# Patient Record
Sex: Female | Born: 1975
Health system: Southern US, Community
[De-identification: ages and names within clinical notes are randomized; demographics above are authoritative.]

## PROBLEM LIST (undated history)

## (undated) DIAGNOSIS — I10 Essential (primary) hypertension: Secondary | ICD-10-CM

## (undated) DIAGNOSIS — R600 Localized edema: Secondary | ICD-10-CM

## (undated) DIAGNOSIS — K219 Gastro-esophageal reflux disease without esophagitis: Secondary | ICD-10-CM

## (undated) DIAGNOSIS — R0683 Snoring: Secondary | ICD-10-CM

## (undated) DIAGNOSIS — E559 Vitamin D deficiency, unspecified: Secondary | ICD-10-CM

## (undated) DIAGNOSIS — E669 Obesity, unspecified: Secondary | ICD-10-CM

## (undated) DIAGNOSIS — K760 Fatty (change of) liver, not elsewhere classified: Secondary | ICD-10-CM

## (undated) DIAGNOSIS — E119 Type 2 diabetes mellitus without complications: Secondary | ICD-10-CM

## (undated) DIAGNOSIS — D4989 Neoplasm of unspecified behavior of other specified sites: Secondary | ICD-10-CM

## (undated) DIAGNOSIS — R5383 Other fatigue: Secondary | ICD-10-CM

## (undated) DIAGNOSIS — R609 Edema, unspecified: Secondary | ICD-10-CM

## (undated) HISTORY — PX: CHOLECYSTECTOMY: SHX55

## (undated) HISTORY — PX: CERVICAL SPINE SURGERY: SHX589

## (undated) HISTORY — DX: Type 2 diabetes mellitus without complications: E11.9

## (undated) HISTORY — DX: Vitamin D deficiency, unspecified: E55.9

## (undated) HISTORY — PX: TONSILLECTOMY AND ADENOIDECTOMY: SUR1326

## (undated) HISTORY — DX: Other fatigue: R53.83

## (undated) HISTORY — DX: Obesity, unspecified: E66.9

## (undated) HISTORY — PX: MYRINGOTOMY WITH TUBE PLACEMENT: SHX5663

## (undated) HISTORY — DX: Fatty (change of) liver, not elsewhere classified: K76.0

## (undated) HISTORY — PX: ABDOMINAL HYSTERECTOMY: SHX81

## (undated) HISTORY — DX: Neoplasm of unspecified behavior of other specified sites: D49.89

## (undated) HISTORY — DX: Essential (primary) hypertension: I10

## (undated) HISTORY — DX: Snoring: R06.83

## (undated) HISTORY — PX: LIPOMA EXCISION: SHX5283

## (undated) HISTORY — DX: Gastro-esophageal reflux disease without esophagitis: K21.9

---

## 2004-10-12 ENCOUNTER — Encounter (INDEPENDENT_AMBULATORY_CARE_PROVIDER_SITE_OTHER): Payer: Self-pay | Admitting: *Deleted

## 2004-10-12 ENCOUNTER — Ambulatory Visit (HOSPITAL_COMMUNITY): Admission: RE | Admit: 2004-10-12 | Discharge: 2004-10-12 | Payer: Self-pay | Admitting: Obstetrics and Gynecology

## 2004-10-13 ENCOUNTER — Encounter (INDEPENDENT_AMBULATORY_CARE_PROVIDER_SITE_OTHER): Payer: Self-pay | Admitting: *Deleted

## 2004-10-13 ENCOUNTER — Inpatient Hospital Stay (HOSPITAL_COMMUNITY): Admission: RE | Admit: 2004-10-13 | Discharge: 2004-10-15 | Payer: Self-pay | Admitting: Obstetrics and Gynecology

## 2005-07-27 ENCOUNTER — Encounter: Admission: RE | Admit: 2005-07-27 | Discharge: 2005-10-25 | Payer: Self-pay | Admitting: Specialist

## 2005-12-12 ENCOUNTER — Ambulatory Visit: Payer: Self-pay | Admitting: Family Medicine

## 2008-10-08 ENCOUNTER — Inpatient Hospital Stay (HOSPITAL_COMMUNITY): Admission: AD | Admit: 2008-10-08 | Discharge: 2008-10-10 | Payer: Self-pay | Admitting: Orthopedic Surgery

## 2010-09-21 LAB — CBC
Hemoglobin: 13.3 g/dL (ref 12.0–15.0)
MCHC: 35.2 g/dL (ref 30.0–36.0)
RBC: 3.94 MIL/uL (ref 3.87–5.11)
RDW: 11.6 % (ref 11.5–15.5)
WBC: 18.4 10*3/uL — ABNORMAL HIGH (ref 4.0–10.5)

## 2010-09-21 LAB — BASIC METABOLIC PANEL
Calcium: 9.6 mg/dL (ref 8.4–10.5)
GFR calc Af Amer: >60 mL/min (ref >60)
GFR calc non Af Amer: >60 mL/min (ref >60)
Sodium: 141 mEq/L (ref 135–145)

## 2010-10-25 NOTE — Op Note (Signed)
Deborah, Sheppard                 ACCOUNT NO.:  [id number]   MEDICAL RECORD NO.:  [id number]          PATIENT TYPE:  OIB   LOCATION:  5016                         FACILITY:  [facility]   PHYSICIAN:  Alvy Beal, MD    DATE OF BIRTH:  Nov 14, 1975   DATE OF PROCEDURE:  DATE OF DISCHARGE:                               OPERATIVE REPORT   PREOPERATIVE DIAGNOSIS:  Cervical disk herniation with left arm  radiculopathy.   POSTOPERATIVE DIAGNOSIS:  Cervical disk herniation with left arm  radiculopathy.   OPERATIVE PROCEDURE:  Anterior cervical diskectomy and fusion at C5-C6.   COMPLICATIONS:  None.   CONDITION:  Stable.   FIRST ASSISTANT:  Crissie Reese, PA   INSTRUMENTATION USED:  Synthes anterior cervical vector plate with 16-XW  locking screws and 14-mm plate with an 8-mm precut MTLF lordotic fibular  spacer packed with Actifuse.   HISTORY:  This is a very pleasant 35 year old woman who presents with  progressive neck and left arm pain.  Clinical and radiographic analysis  confirmed the diagnosis of a soft disk protrusion causing herniation and  causing C6 radicular pain.  Her clinical exam is consistent with this.  Attempts at conservative management have failed to alleviate her  symptoms and so she elected to proceed with surgery.  All appropriate  risks, benefits and alternatives were discussed with the patient and  consent was obtained.   OPERATIVE NOTE:  The patient was brought to the operating room, placed  supine on the operating table.  After successful induction of general  anesthesia and endotracheal intubation, SCDs and TEDs were applied.  Rolled towels were placed between the shoulder blades and the neck was  placed into a neutral alignment.  The cervical spine was then prepped  and draped in a standard fashion.   A transverse incision was made over the thyroid cartilage and sharp  dissection was carried out down to and through the platysma.  I then  bluntly  dissected through the deep cervical fascia and prevertebral  fascia keeping the trachea and esophagus medially and the carotid sheath  laterally.  I was then able to palpate the anterior longitudinal  ligament.  I palpated the carotid sheath and then placed an appendiceal  retractor to retract the trachea and esophagus.  This allowed me to  protect the carotid sheath that was inserted in the retractor.   Kitner dissectors were used to mobilize the remaining prevertebral  fascia to completely expose the anterior longitudinal ligament.  I then  placed a needle into the C5-C6 disk space and took an x-ray.  Intraoperative fluoroscopy confirmed that I was at the appropriate  level.   I then mobilized the anterior longitudinal ligament from the midbody of  C5 to the midbody of C6 using bipolar electrocautery and Bovie.  I then  dissected underneath the longus colli muscles bilaterally to expose out  to the uncovertebral joints.   Once I had an adequate exposure, I then placed 40-mm self-retaining  Caspar retractor blades into the wound made sure the blades were  underneath the longus colli.  I  then deflated the endotracheal cuff,  expanded, then retracted and reinflated the endotracheal cuff.  This  allowed me to reset the endotracheal pressure.  I then placed  distraction pins into the bodies of C5-C6 and gently distracted the C5-  C6 disk space.   An annulotomy was performed with a 15 blade scalpel and then using a  combination of pituitary rongeurs, curettes and Kerrison rongeurs, I  resected all of the disk material.  Once I was down to the posterior  aspect, I used a 1-mm Kerrison rongeur to resect the underlying  posterior osteophytes and to get underneath the annulus.  At this point,  I visualized the posterior longitudinal ligament.  Using a fine curette,  I developed a plane underneath the posterior longitudinal ligament and  resected this with a 1-mm Kerrison.  I then made sure I  was able to go  underneath the uncovertebral joint, especially on the left-hand side as  this was the more symptomatic side to ensure the nerve roots adequate  and the C6 nerve was adequately decompressed.  Once I confirmed this, I  then proceeded with my instrumentation.  I measured the interbody space,  took a precut lordotic MTLF fibular spacer 8 mm high, soaked it  appropriately and then packed it with Actifuse and malleted it to the  appropriate finding resting position.  I confirmed satisfactory position  in the AP and lateral planes and I took a 14-mm anterior cervical vector  plate, contoured into gentle lordosis and then secured it with 14-mm  self-drilling and self-tapping screws.  They were all locked down.  Final x-rays demonstrated satisfactory position of the hardware and  graft.  I then removed the self-retaining retractors and again checked  the esophagus to ensure that it was not inadvertently entrapped  underneath the plate.  I irrigated copiously, returned the trachea and  esophagus to midline and obtained hemostasis using bipolar  electrocautery.  I then closed the platysma with interrupted 2-0 Vicryl  sutures and the skin with a 3-0 Monocryl.  Steri-Strips, dry dressing  and Aspen collar were applied and the patient was ultimately extubated  and transferred to the PACU without incident.  At the end of the case,  all needle and sponge counts were correct.      Alvy Beal, MD  Electronically Signed     DDB/MEDQ  D:  10/08/2008  T:  10/08/2008  Job:  [id number]

## 2010-10-28 NOTE — Discharge Summary (Signed)
[name], Sheppard                 ACCOUNT NO.:  [id number]   MEDICAL RECORD NO.:  [id number]          PATIENT TYPE:  INP   LOCATION:  5016                         FACILITY:  [facility]   PHYSICIAN:  Alvy Beal, MD    DATE OF BIRTH:  Apr 18, 1976   DATE OF ADMISSION:  10/08/2008  DATE OF DISCHARGE:  10/10/2008                               DISCHARGE SUMMARY   ADMISSION DIAGNOSIS:  Cervical disk herniation with left arm  radiculopathy.   DISCHARGE DIAGNOSIS:  Cervical disk herniation with left arm  radiculopathy.   OPERATIVE PROCEDURE:  Anterior cervical diskectomy and fusion at C5-C6  level done on October 08, 2008.   CONSULTATIONS:  None.   BRIEF HISTORY:  Ms. Deborah Sheppard is a very pleasant 35 year old female who  presented with progressive neck and left arm pain.  Clinical and  radiographic analysis confirmed the diagnosis of a soft disk protrusion  causing herniation and causing C6 radicular pain.  Her clinical exam was  consistent with this.  All attempts at conservative management had  failed to alleviate her symptoms.  Therefore, she elected to proceed  with surgery.  All appropriate risks, benefits, and alternatives were  discussed with the patient and a consent was obtained.   HOSPITAL COURSE:  The patient's hospital course was approximately 2 days  in length.  The patient tolerated the procedure very well and was  transferred from the PACU to the orthopedic floor without any incidence.  Postoperatively on day #1, the patient's arm pain had improved.  She had  some mild complaints of nausea and vomiting without emesis.  Vital signs  were stable and afebrile.  She was neurovascularly intact.  She had  began to void on her own.  Her dressing was clean, dry, and intact and  she was scheduled to begin working with the physical therapy.  Postoperatively on day #2, the patient had made significant progress  with physical therapy.  Her pain was controlled on oral medications.  Radiographs had been reviewed by Dr. Shon Baton in the office.  They  demonstrated stable placement of the hardware.  Nausea and vomiting had  significantly improved.  Therefore, she was deemed stable to be  discharged home with Home Health Physical Therapy.   DISCHARGE MEDICATIONS:  The patient was allowed to continue her Zantac  300 mg daily, Neurontin 300 mg 3 times a day, and multivitamins daily.  She was instructed to discontinue the use of her Norco 5/325.  She is  being discharged home on the new medication of:  1. Percocet 10/325 one tablet p.o. q.6 h. p.r.n. pain.  2. Robaxin 500 mg 1 tablet p.o. q.8 h. p.r.n. spasms.  3. Phenergan 25 mg 1 tablet p.o. q.8 h. p.r.n. nausea or vomiting.  4. Baby aspirin 81 mg 1 tablet daily.   DISCHARGE INSTRUCTIONS:  The patient was given preprinted discharge  instructions from our office that explained in detail activity such as,  the patient is to lift nothing heavier than 8 gallons of milk.  She is  to walk every day.  She is to change her  dressing daily x7 days.  She is  allowed to shower postoperatively on day #5.  No baths or soaks or hot  tubs.  She can after 7 days.  She can leave her incision open to air and  Steri-Strips are to remain intact.  She is to call our office for any  increasing arm pain, neck pain, loss of bowel or bladder function,  increasing fevers  or redness or erythema around the incision site.  The patient is  scheduled to follow up at our office in 2 weeks.  She needs to call our  office at [phone] to set up this appointment.  At that time, we will  remove her sutures and inspect her wound.  The patient needs to call our  office again at [phone] to schedule this appointment.      Crissie Reese, PA      Alvy Beal, MD  Electronically Signed    AC/MEDQ  D:  11/05/2008  T:  11/06/2008  Job:  [id number]

## 2010-10-28 NOTE — Discharge Summary (Signed)
NAMEENORA, TRILLO                 ACCOUNT NO.:  [id number]   MEDICAL RECORD NO.:  [id number]          PATIENT TYPE:  INP   LOCATION:  9312                          FACILITY:  [facility]   PHYSICIAN:  Malva Limes, M.D.    DATE OF BIRTH:  1976/05/22   DATE OF ADMISSION:  10/13/2004  DATE OF DISCHARGE:  10/15/2004                                 DISCHARGE SUMMARY   PRINCIPAL DISCHARGE DIAGNOSES:  1. Severe cervical dysplasia.  2. Menorrhagia.  3. Dysmenorrhea.     HISTORY OF PRESENT ILLNESS:  Ms. Ringstad is a 35 year old white female, G5,  P5, who presented to the operating room on Oct 13, 2004, for a total vaginal  hysterectomy secondary to a history of severe cervical dysplasia,  menorrhagia and dysmenorrhea.  The patient has a multiple-year history of  dysplasia.  When the dysplasia was first discovered several years ago, she  was scheduled for a LEEP and did not follow through.  More recently she had  a Pap smear revealing severe dysplasia with the possibility of carcinoma.  Because of this patient underwent a cold knife cone on Oct 12, 2004.  The  pathology revealed only severe dysplasia.  The margins were negative.   HOSPITAL COURSE:  The patient was underwent a total vaginal hysterectomy on  Oct 13, 2004, without complications.  A complete description of this can be  found in the dictated operative note.  The patient's pathology revealed only  CIN-I.  Everything else was benign.  The patient's postoperative course was  complicated by nausea, which was felt to be secondary to morphine use.  Once  this was changed and Phenergan added, the patient's symptoms improved.  The  patient was discharged to home on postoperative day #2.  She was sent home  with Demerol, Motrin, Transderm scopolamine and some Nexium.  The patient's  postoperative hemoglobin was 10.8.  At the time of discharge the patient's  vital signs are stable, she is afebrile, she was ambulating without  difficulty and had  flatus.      MA/MEDQ  D:  11/09/2004  T:  11/09/2004  Job:  [id number]

## 2010-10-28 NOTE — Op Note (Signed)
NAMECHARDE, Deborah Sheppard                 ACCOUNT NO.:  [id number]   MEDICAL RECORD NO.:  [id number]          PATIENT TYPE:  INP   LOCATION:  9399                          FACILITY:  [facility]   PHYSICIAN:  Malva Limes, M.D.    DATE OF BIRTH:  [birth year]   DATE OF PROCEDURE:  10/13/2004  DATE OF DISCHARGE:                                 OPERATIVE REPORT   PREOPERATIVE DIAGNOSES:  1.  Severe cervical dysplasia.  2.  Menorrhagia.  3.  Dysmenorrhea.  4.  Status post cervical conization Oct 12, 2004.   POSTOPERATIVE DIAGNOSES:  1.  Severe cervical dysplasia.  2.  Menorrhagia.  3.  Dysmenorrhea.  4.  Status post cervical conization Oct 12, 2004.   PROCEDURE:  Total vaginal hysterectomy.   SURGEON:  Malva Limes, M.D.   ASSISTANT:  Randye Lobo, M.D.   ANESTHESIA:  General endotracheal.   ANTIBIOTICS:  Ancef 1 g.   DRAINS:  Foley to bedside drainage.   ESTIMATED BLOOD LOSS:  150 mL.   SPECIMENS:  Cervix and uterus sent to pathology.   COMPLICATIONS:  None.   PROCEDURE:  The patient was taken to the operating room, where she was  placed in the dorsal supine position, and general anesthetic was  administered without complications.  She was then placed in dorsal lithotomy  position, and she was prepped with Hibiclens and draped in the usual fashion  for this procedure.  A weighted speculum was placed in the vagina, a single-  tooth tenaculum was applied to the cervix.  The posterior cul-de-sac was  entered sharply.  The uterosacral ligaments were bilaterally clamped, cut  and ligated with 0 Monocryl suture.  The cervix was then circumscribed.  The  anterior cul-de-sac was entered sharply.  The cardinal ligaments were  serially clamped, cut and ligated with 0 Monocryl suture.  The uterine  vessels were bilaterally clamped, cut and ligated with 0 Vicryl suture.  Once the level of the fallopian tube was reached, the uterus was inverted  and the triple pedicle, which included the  fallopian tube, the round  ligament and the ovarian ligament, were bilaterally clamped, cut and ligated  x2 with 0 Vicryl suture.  All pedicles were then checked and found to be  hemostatic.  Ovaries were examined  and appeared to be normal.  At this point a Foley catheter was placed and  the urine was noted to be clear.  The vagina was then closed using 2-0  Vicryl in a running locking fashion.  The patient tolerated the procedure  well, and she was taken to the recovery room in stable condition.  Instrument and lap counts were correct x2.      MA/MEDQ  D:  10/13/2004  T:  10/13/2004  Job:  [id number]

## 2010-10-28 NOTE — Op Note (Signed)
Deborah Sheppard, BUCHTA                 ACCOUNT NO.:  [id number]   MEDICAL RECORD NO.:  [id number]          PATIENT TYPE:  AMB   LOCATION:  SDC                           FACILITY:  [facility]   PHYSICIAN:  Malva Limes, M.D.    DATE OF BIRTH:  02-18-1976   DATE OF PROCEDURE:  10/12/2004  DATE OF DISCHARGE:                                 OPERATIVE REPORT   PREOPERATIVE DIAGNOSES:  1.  Carcinoma in situ.  2.  Rule out cervical cancer.   POSTOPERATIVE DIAGNOSES:  1.  Carcinoma in situ.  2.  Rule out cervical cancer.   PROCEDURE:  Cold knife conization.   SURGEON:  Malva Limes, M.D.   ANESTHESIA:  General.   ANTIBIOTICS:  Ancef 1 g.   DRAINS:  Foley to bedside drainage.   ESTIMATED BLOOD LOSS:  50 mL.   SPECIMENS:  Cervical cone sent to pathology.   PROCEDURE:  The patient was taken to the operating room, where she was  placed in the dorsal supine position and a general anesthetic was  administered without complications.  She was then prepped with Hibiclens and  draped in the usual fashion for this procedure.  A weighted speculum was  placed in the vagina.  Retractors were then placed.  Chromic 0 sutures were  placed at 3 o'clock and 9 o'clock in the fornix.  At this point the cold  knife conization was performed and the specimen sent to pathology.  Sturmdorf sutures were placed superiorly and inferiorly using 0 Vicryl  suture.  At the conclusion of the procedure, there was a small amount of  bleeding at 9 o'clock, which was made hemostatic with a single interrupted 0  chromic suture.  The patient tolerated the procedure well.  She was taken to  the recovery room in stable condition.  She will be discharged to home.  She  will be instructed to call the office tomorrow for pathology, where she will  have a total vaginal hysterectomy if the pathology does not indicate  cervical carcinoma.  The patient will be sent home with Anaprox Double  Strength p.r.n.     MA/MEDQ  D:   10/12/2004  T:  10/12/2004  Job:  [id number]

## 2012-10-14 ENCOUNTER — Other Ambulatory Visit: Payer: Self-pay

## 2012-10-16 ENCOUNTER — Other Ambulatory Visit: Payer: Self-pay

## 2012-10-16 ENCOUNTER — Other Ambulatory Visit: Payer: Self-pay | Admitting: Medical

## 2012-10-16 DIAGNOSIS — E010 Iodine-deficiency related diffuse (endemic) goiter: Secondary | ICD-10-CM

## 2012-10-16 DIAGNOSIS — Z1231 Encounter for screening mammogram for malignant neoplasm of breast: Secondary | ICD-10-CM

## 2012-10-18 ENCOUNTER — Ambulatory Visit
Admission: RE | Admit: 2012-10-18 | Discharge: 2012-10-18 | Disposition: A | Discharge status: Home / Self Care | Payer: 59 | Source: Ambulatory Visit | Attending: Medical | Admitting: Medical

## 2012-10-18 DIAGNOSIS — E010 Iodine-deficiency related diffuse (endemic) goiter: Secondary | ICD-10-CM

## 2012-10-20 ENCOUNTER — Emergency Department (HOSPITAL_BASED_OUTPATIENT_CLINIC_OR_DEPARTMENT_OTHER): Payer: 59

## 2012-10-20 ENCOUNTER — Encounter (HOSPITAL_BASED_OUTPATIENT_CLINIC_OR_DEPARTMENT_OTHER): Payer: Self-pay | Admitting: Emergency Medicine

## 2012-10-20 ENCOUNTER — Emergency Department (HOSPITAL_BASED_OUTPATIENT_CLINIC_OR_DEPARTMENT_OTHER)
Admission: EM | Admit: 2012-10-20 | Discharge: 2012-10-20 | Disposition: A | Discharge status: Home / Self Care | Payer: 59 | Attending: Emergency Medicine | Admitting: Emergency Medicine

## 2012-10-20 DIAGNOSIS — M542 Cervicalgia: Secondary | ICD-10-CM

## 2012-10-20 DIAGNOSIS — K219 Gastro-esophageal reflux disease without esophagitis: Secondary | ICD-10-CM | POA: Insufficient documentation

## 2012-10-20 DIAGNOSIS — Z79899 Other long term (current) drug therapy: Secondary | ICD-10-CM | POA: Insufficient documentation

## 2012-10-20 DIAGNOSIS — Z91040 Latex allergy status: Secondary | ICD-10-CM | POA: Insufficient documentation

## 2012-10-20 DIAGNOSIS — M25519 Pain in unspecified shoulder: Secondary | ICD-10-CM | POA: Insufficient documentation

## 2012-10-20 HISTORY — DX: Localized edema: R60.0

## 2012-10-20 HISTORY — DX: Gastro-esophageal reflux disease without esophagitis: K21.9

## 2012-10-20 HISTORY — DX: Edema, unspecified: R60.9

## 2012-10-20 MED ORDER — OXYCODONE-ACETAMINOPHEN 5-325 MG PO TABS: 2.0000 | ORAL_TABLET | ORAL | Status: DC | PRN

## 2012-10-20 MED ORDER — PREDNISONE 10 MG PO TABS: ORAL_TABLET | ORAL | Status: DC

## 2012-10-20 MED ORDER — OXYCODONE-ACETAMINOPHEN 5-325 MG PO TABS
2.0000 | ORAL_TABLET | Freq: Once | ORAL | Status: AC
  Administered 2012-10-20: 2 via ORAL
  Filled 2012-10-20 (×2): qty 2

## 2012-10-20 NOTE — ED Notes (Signed)
Pt states she had neck surgery in 2011.  Pt states her neck and shoulders have been hurting.  Pt concerned that her plates and screws have moved or there is a new "ruptured disc".  Pt states she has tried all home remedies.

## 2012-10-20 NOTE — ED Provider Notes (Signed)
History     CSN: [id number]  Arrival date & time 10/20/12  1749   First MD Initiated Contact with Patient 10/20/12 2046      Chief Complaint  Patient presents with  . Neck Pain  . Shoulder Pain    (Consider location/radiation/quality/duration/timing/severity/associated sxs/prior treatment) Patient is a 37 y.o. female presenting with neck pain and shoulder pain. The history is provided by the patient. No language interpreter was used.  Neck Pain Pain location:  Generalized neck Quality:  Aching Pain radiates to:  Does not radiate Pain severity:  Moderate Pain is:  Same all the time Onset quality:  Gradual Duration:  4 days Timing:  Constant Progression:  Worsening Chronicity:  New Relieved by:  Nothing Worsened by:  Nothing tried Ineffective treatments:  None tried Shoulder Pain Associated symptoms include neck pain.  Pt has neck surgery in 2011.   Pt complains of pain going down her left arm  Past Medical History  Diagnosis Date  . GERD (gastroesophageal reflux disease)   . Peripheral edema     Past Surgical History  Procedure Laterality Date  . Cervical spine surgery    . Abdominal hysterectomy    . Cholecystectomy      No family history on file.  History  Substance Use Topics  . Smoking status: Not on file  . Smokeless tobacco: Not on file  . Alcohol Use: Not on file    OB History   Grav Para Term Preterm Abortions TAB SAB Ect Mult Living                  Review of Systems  HENT: Positive for neck pain.   All other systems reviewed and are negative.    Allergies  Biaxin; Imitrex; and Latex  Home Medications   Current Outpatient Rx  Name  Route  Sig  Dispense  Refill  . hydrochlorothiazide (HYDRODIURIL) 25 MG tablet   Oral   Take 25 mg by mouth daily.         . ranitidine (ZANTAC) 150 MG capsule   Oral   Take 150 mg by mouth 2 (two) times daily.           BP 148/96  Pulse 92  Temp(Src) 98 F (36.7 C) (Oral)  Resp 16  Ht  5\' 6"  (1.676 m)  Wt 213 lb (96.616 kg)  BMI 34.4 kg/m2  SpO2 97%  Physical Exam  Nursing note and vitals reviewed. Constitutional: She is oriented to person, place, and time. She appears well-developed and well-nourished.  HENT:  Head: Normocephalic.  Eyes: Pupils are equal, round, and reactive to light.  Neck: Normal range of motion.  Tender c spine diffusely,  Tender left trapezius  Cardiovascular: Normal rate.   Pulmonary/Chest: Effort normal.  Musculoskeletal: Normal range of motion.  Neurological: She is alert and oriented to person, place, and time. She has normal reflexes.  Skin: Skin is warm.  Psychiatric: She has a normal mood and affect.    ED Course  Procedures (including critical care time)  Labs Reviewed - No data to display Dg Cervical Spine Complete  10/20/2012  *RADIOLOGY REPORT*  Clinical Data: Neck and shoulder pain.  CERVICAL SPINE - COMPLETE 4+ VIEW  Comparison: None.  Findings: Prior anterior fusion at C5-6.  Loss of normal cervical lordosis.  No fracture.  Prevertebral soft tissues are normal.  No subluxation.  IMPRESSION: Cervical straightening which may be positional or related to muscle spasm.  Prior anterior fusion at C5-6.  No acute bony abnormality.   Original Report Authenticated By: Charlett Nose, M.D.      No diagnosis found.    MDM  Pt had surgery by Dr. Shon Baton.  I will treat with Prednisone and Percocet.  Pt to see Dr. Shon Baton for recheck this week.        Elson Areas, PA-C 10/20/12 2111  Medical screening examination/treatment/procedure(s) were performed by non-physician practitioner and as supervising physician I was immediately available for consultation/collaboration.  Derwood Kaplan, MD 10/21/12 [phone]

## 2012-11-22 ENCOUNTER — Ambulatory Visit: Payer: Self-pay

## 2012-12-09 ENCOUNTER — Ambulatory Visit
Admission: RE | Admit: 2012-12-09 | Discharge: 2012-12-09 | Disposition: A | Discharge status: Home / Self Care | Payer: 59 | Source: Ambulatory Visit

## 2012-12-09 DIAGNOSIS — Z1231 Encounter for screening mammogram for malignant neoplasm of breast: Secondary | ICD-10-CM

## 2013-09-01 ENCOUNTER — Institutional Professional Consult (permissible substitution): Payer: 59 | Admitting: Pulmonary Disease

## 2013-09-01 ENCOUNTER — Encounter: Payer: Self-pay | Admitting: Pulmonary Disease

## 2013-10-18 IMAGING — US US SOFT TISSUE HEAD/NECK
1 series · 14 of 25 positions shown · non-contrast
Comparison: None.

CLINICAL DATA: Thyromegaly

THYROID ULTRASOUND
TECHNIQUE: Ultrasound examination of the thyroid gland and adjacent
soft tissues was performed.

[Series 1: us soft tissue head/neck · 0.07mm/px · 14 of 40 slices shown]
[im 1/40]
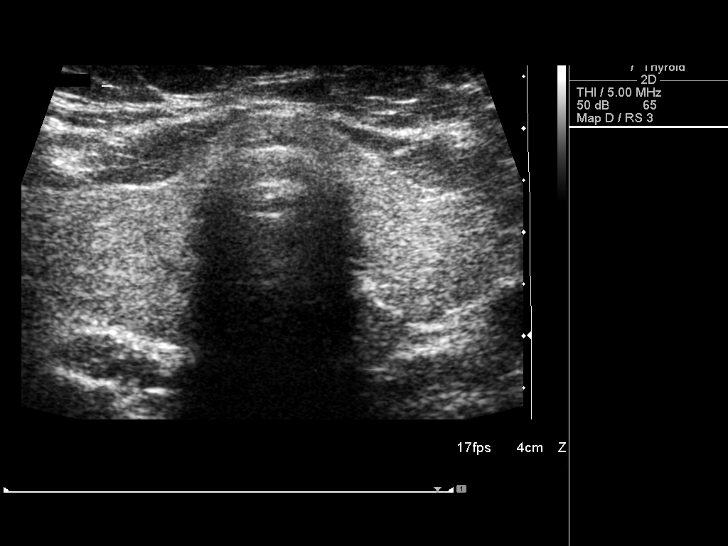
[im 4/40]
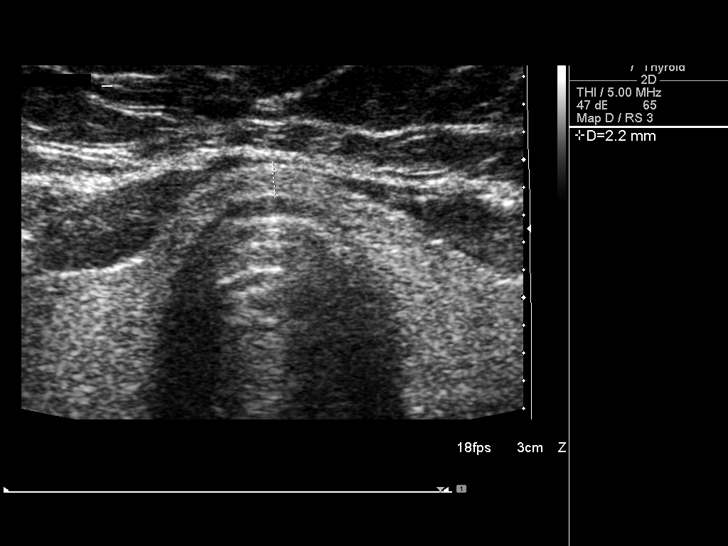
[im 7/40]
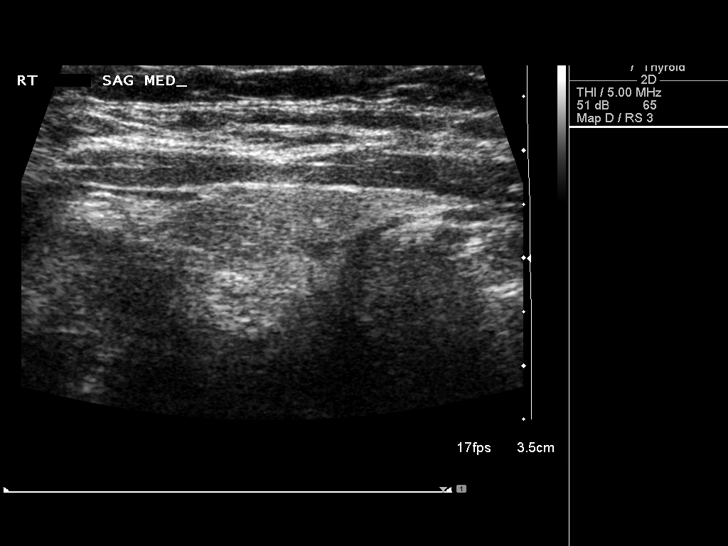
[im 10/40]
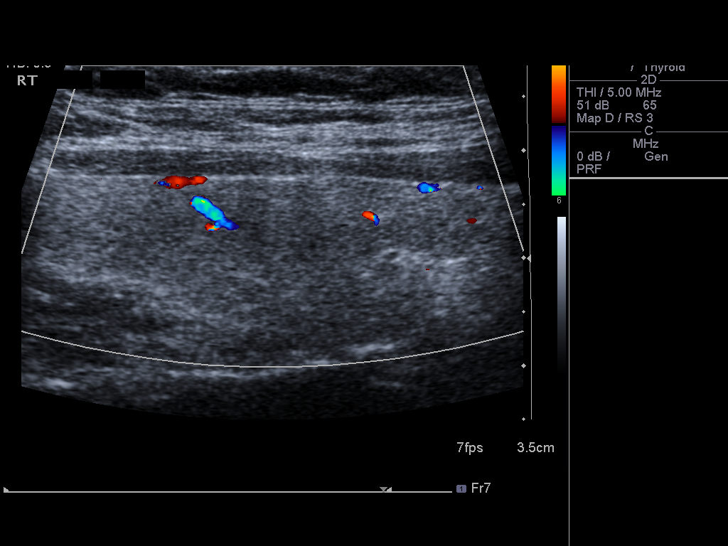
[im 14/40]
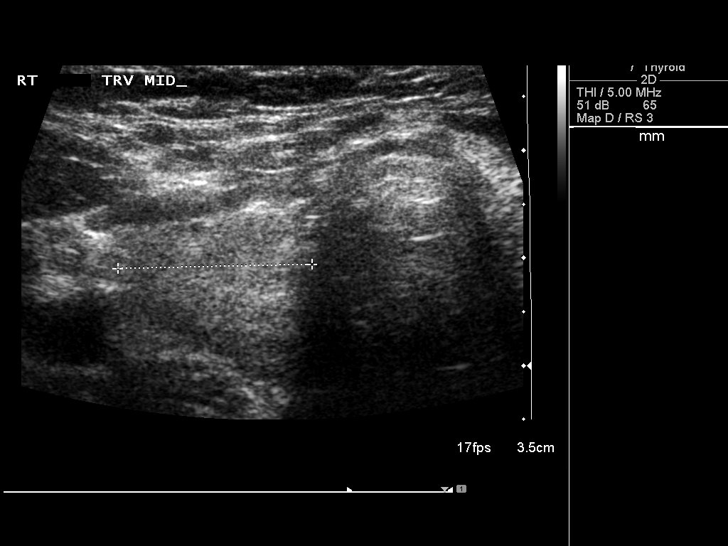
[im 15/40]
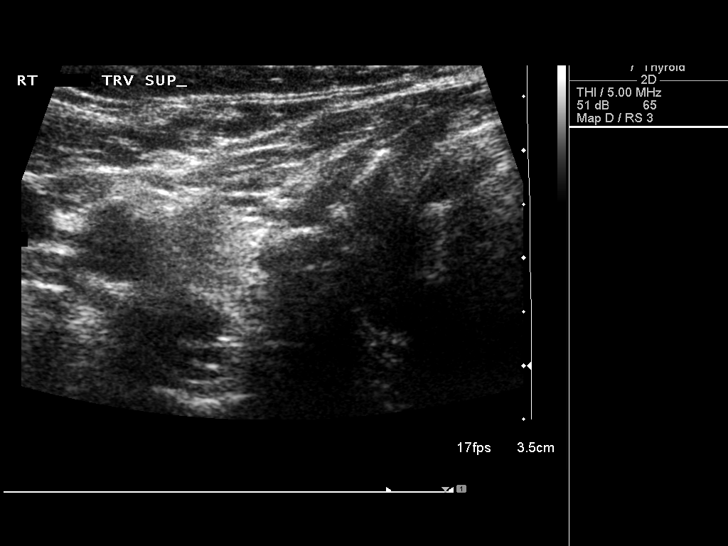
[im 18/40]
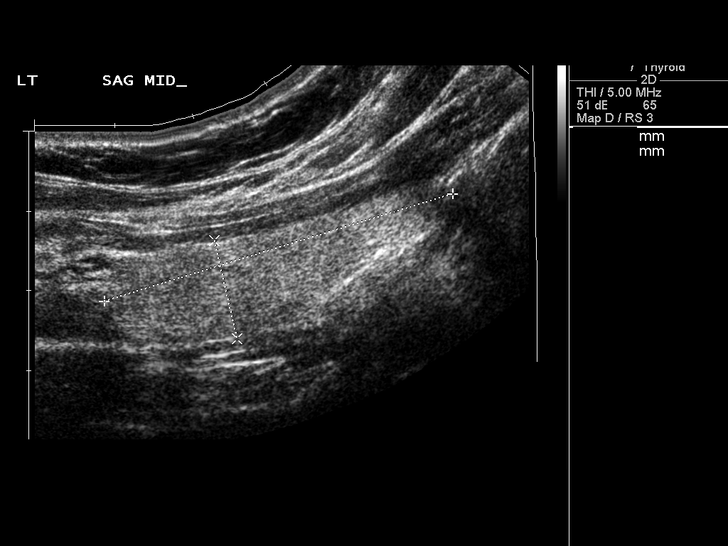
[im 22/40]
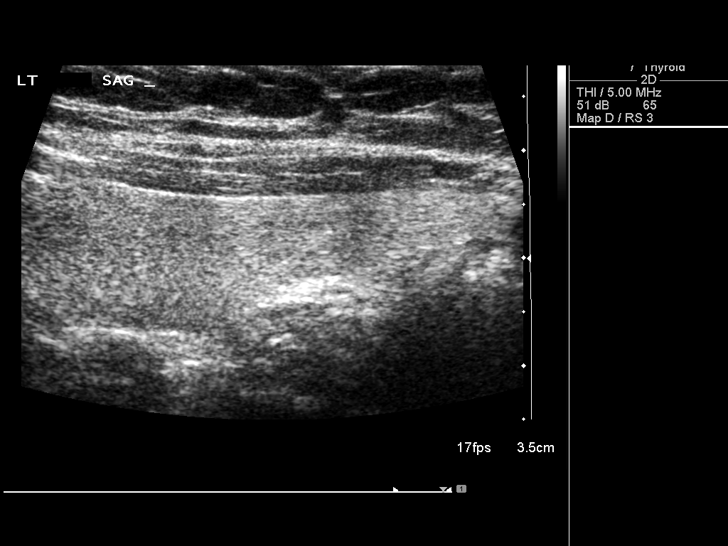
[im 25/40]
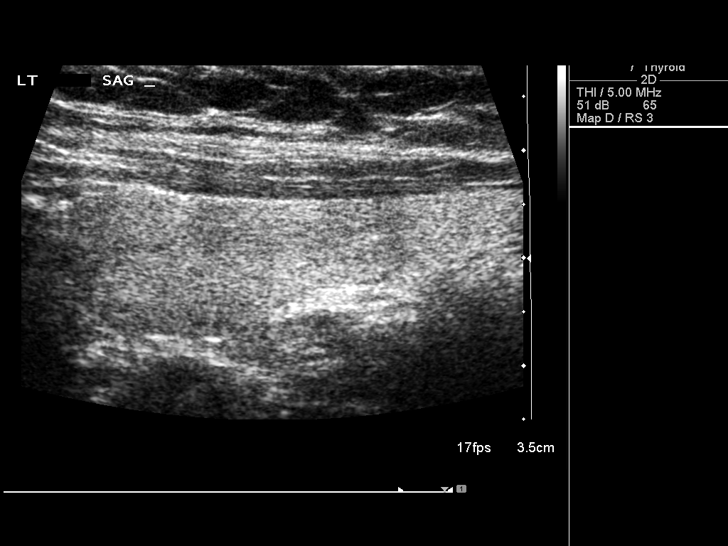
[im 27/40]
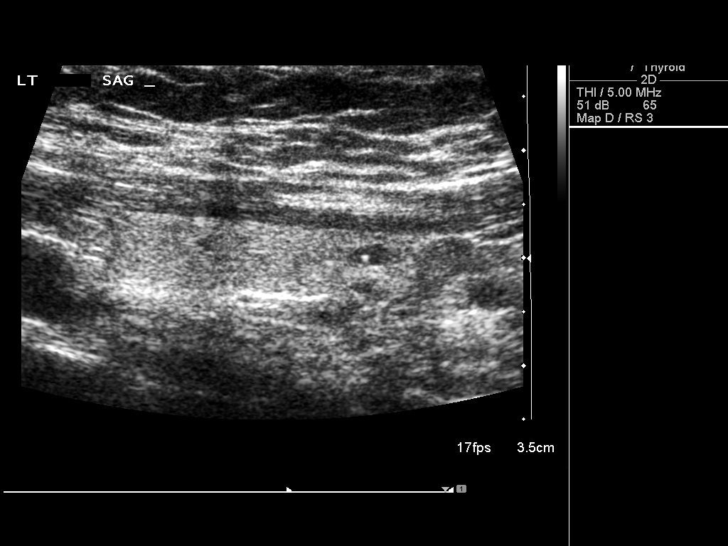
[im 30/40]
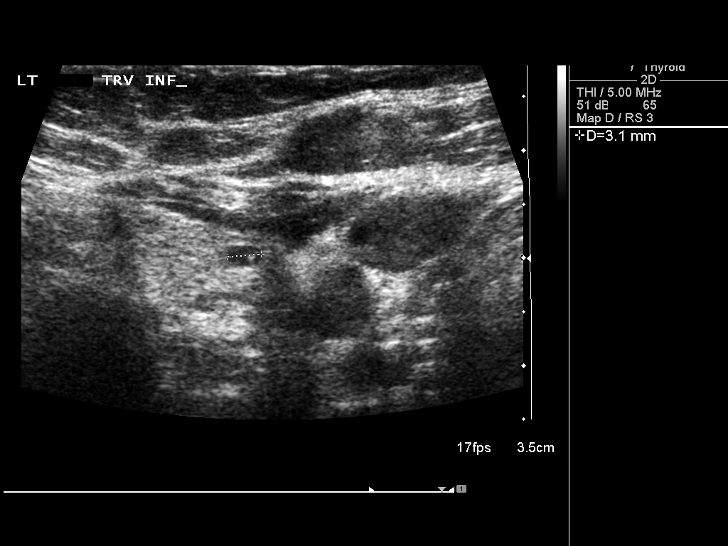
[im 33/40]
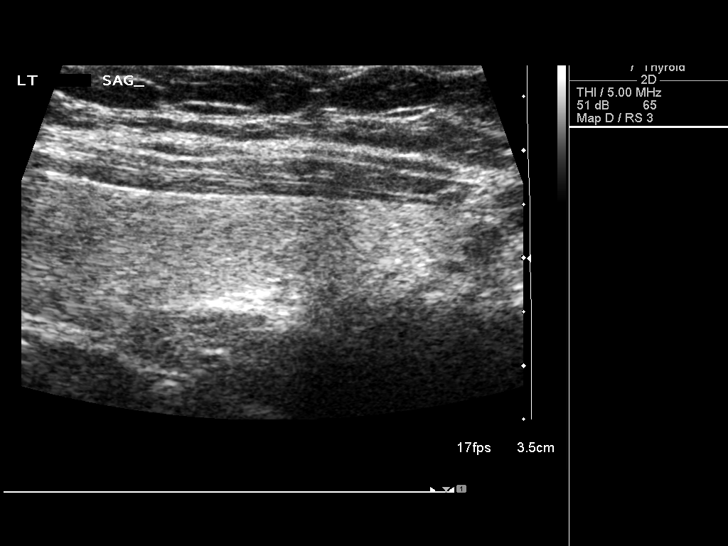
[im 36/40]
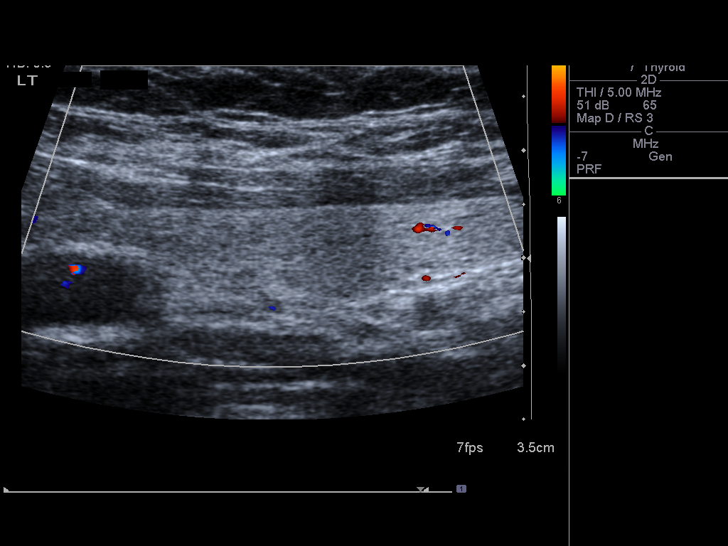
[im 40/40]
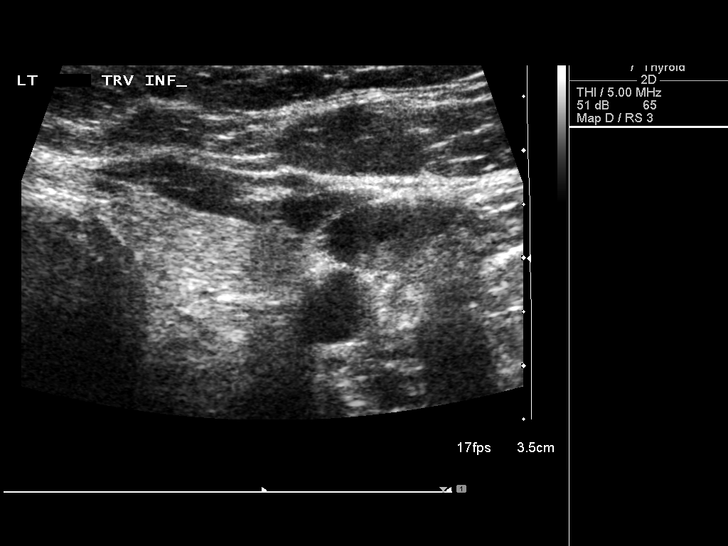

[14 of 25 positions shown; findings below may reference images not displayed]

FINDINGS: Right thyroid lobe:  18 x 20 x 50 mm, homogeneous echotexture
Left thyroid lobe:  13 x 18 x 46 mm with a 3 mm lower pole cyst
Isthmus:  2 mm in thickness

Focal nodules:  None

Lymphadenopathy:  None visualized.
IMPRESSION: Mild thyromegaly without dominant mass or nodule.

## 2013-10-20 IMAGING — CR DG CERVICAL SPINE COMPLETE 4+V
7 series · 7 of 7 positions shown · non-contrast
Comparison: None.

CLINICAL DATA: Neck and shoulder pain.

CERVICAL SPINE - COMPLETE 4+ VIEW

[w c-spine lat]
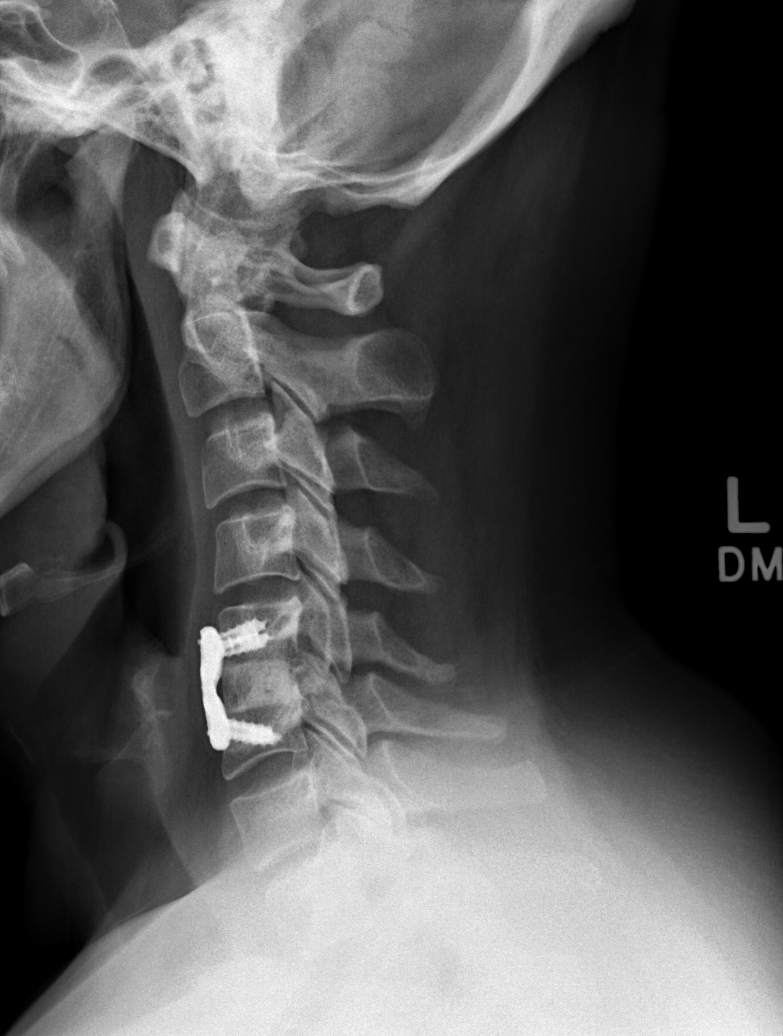

[w c-spine oblique (1 of 2)]
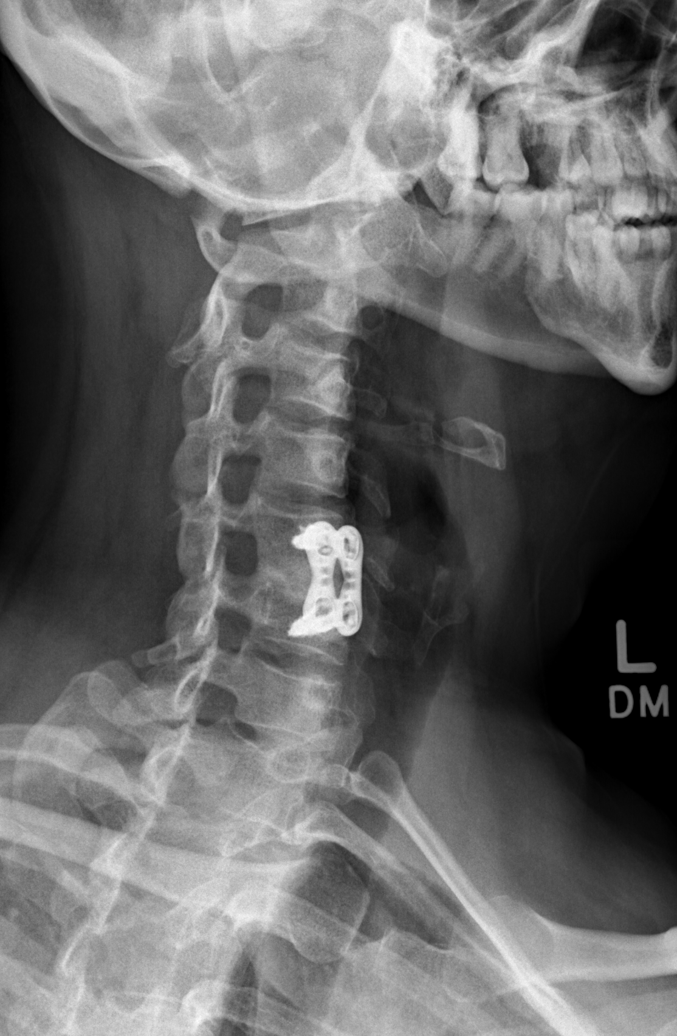

[w c-spine oblique (2 of 2)]
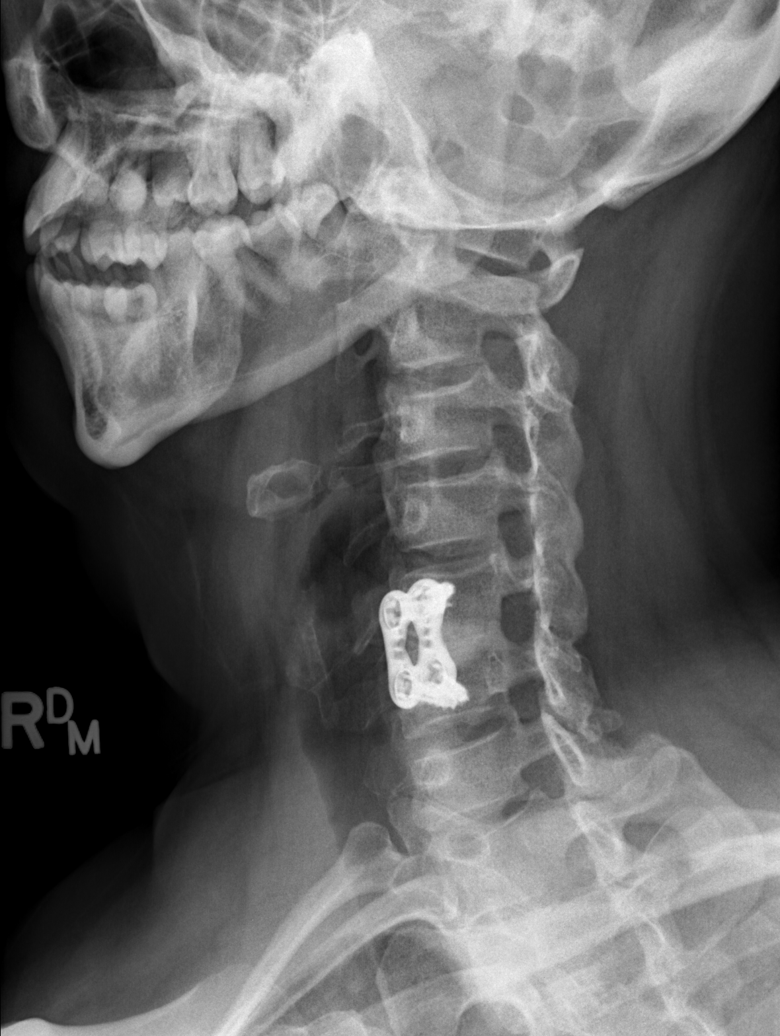

[w c-spine a.p.]
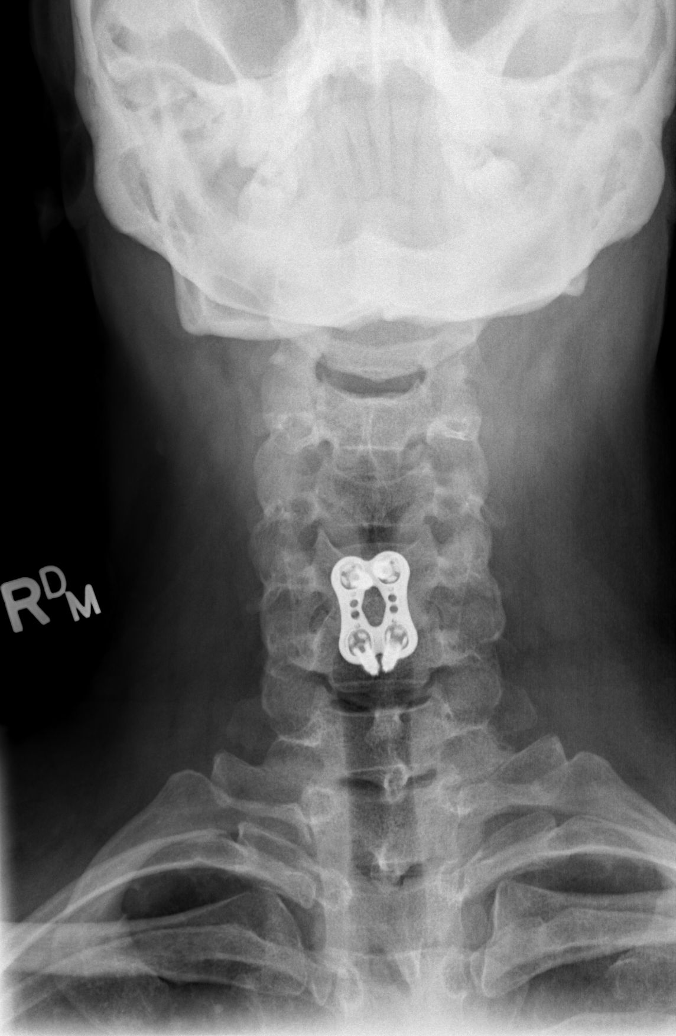

[w c-spine odontoid (1 of 2)]
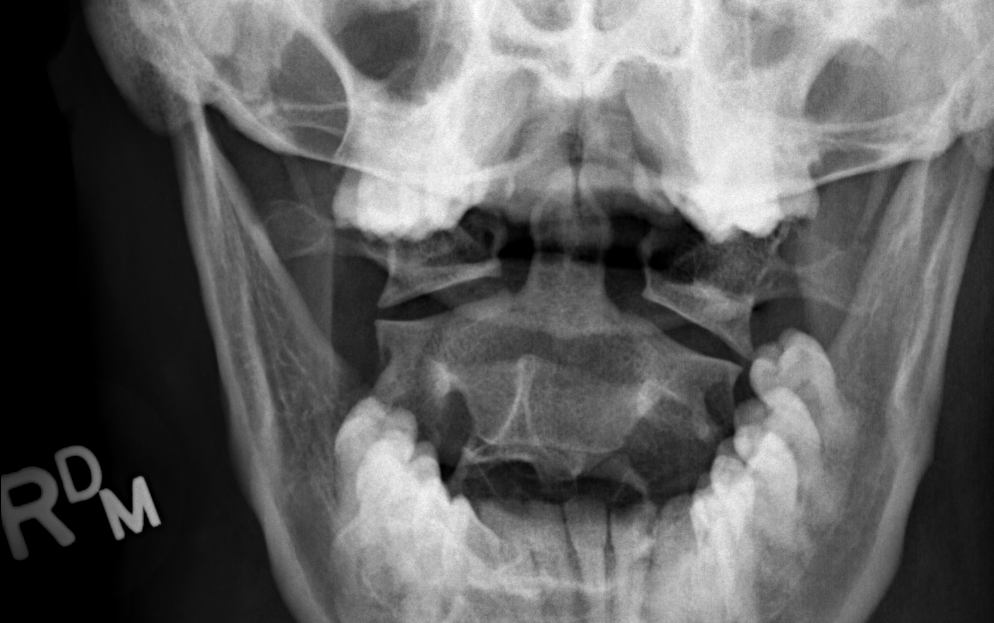

[w c-spine odontoid (2 of 2)]
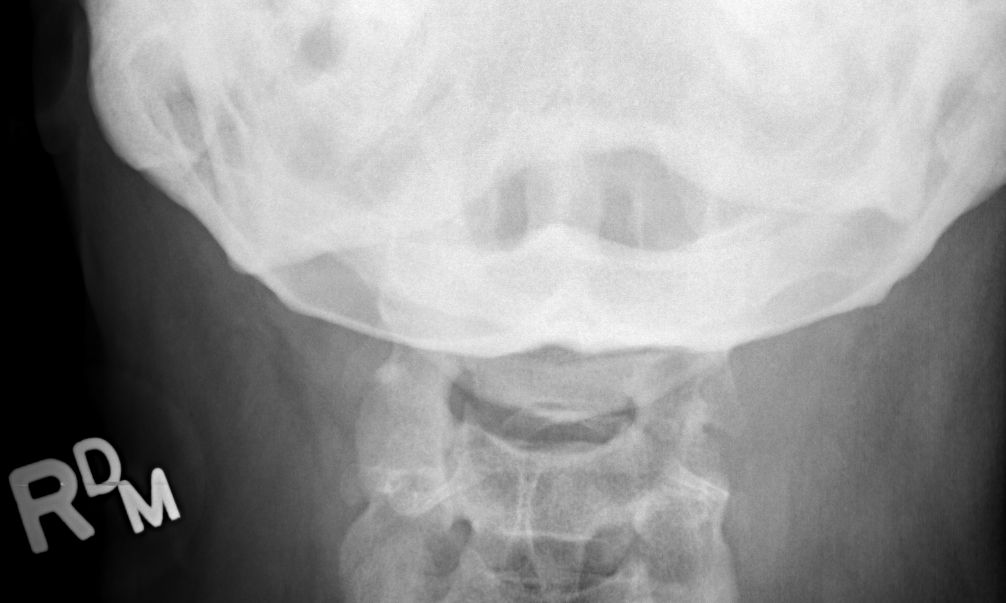

[w swimmers view]
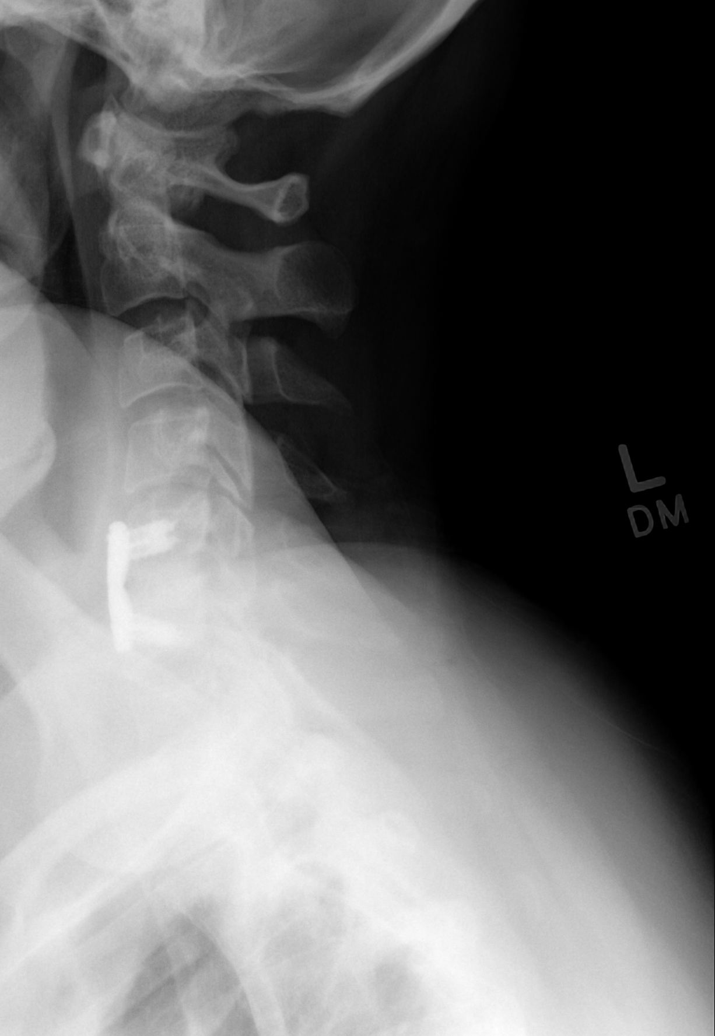

[7 of 7 positions shown; findings below may reference images not displayed]

FINDINGS: Prior anterior fusion at C5-6.  Loss of normal cervical
lordosis.  No fracture.  Prevertebral soft tissues are normal.  No
subluxation.
IMPRESSION: Cervical straightening which may be positional or related to muscle
spasm.  Prior anterior fusion at C5-6.  No acute bony abnormality.

## 2014-10-19 ENCOUNTER — Other Ambulatory Visit: Payer: Self-pay

## 2014-10-19 DIAGNOSIS — Z1231 Encounter for screening mammogram for malignant neoplasm of breast: Secondary | ICD-10-CM

## 2014-11-23 ENCOUNTER — Ambulatory Visit
Admission: RE | Admit: 2014-11-23 | Discharge: 2014-11-23 | Disposition: A | Discharge status: Home / Self Care | Payer: BLUE CROSS/BLUE SHIELD | Source: Ambulatory Visit

## 2014-11-23 DIAGNOSIS — Z1231 Encounter for screening mammogram for malignant neoplasm of breast: Secondary | ICD-10-CM

## 2015-10-21 DIAGNOSIS — E790 Hyperuricemia without signs of inflammatory arthritis and tophaceous disease: Secondary | ICD-10-CM | POA: Diagnosis not present

## 2015-10-21 DIAGNOSIS — Z130 Encounter for screening for diseases of the blood and blood-forming organs and certain disorders involving the immune mechanism: Secondary | ICD-10-CM | POA: Diagnosis not present

## 2015-10-21 DIAGNOSIS — E1165 Type 2 diabetes mellitus with hyperglycemia: Secondary | ICD-10-CM | POA: Diagnosis not present

## 2015-10-21 DIAGNOSIS — R229 Localized swelling, mass and lump, unspecified: Secondary | ICD-10-CM | POA: Diagnosis not present

## 2015-10-21 DIAGNOSIS — R252 Cramp and spasm: Secondary | ICD-10-CM | POA: Diagnosis not present

## 2015-10-21 DIAGNOSIS — Z713 Dietary counseling and surveillance: Secondary | ICD-10-CM | POA: Diagnosis not present

## 2015-10-21 DIAGNOSIS — E782 Mixed hyperlipidemia: Secondary | ICD-10-CM | POA: Diagnosis not present

## 2015-10-21 DIAGNOSIS — I10 Essential (primary) hypertension: Secondary | ICD-10-CM | POA: Diagnosis not present

## 2015-10-21 DIAGNOSIS — E039 Hypothyroidism, unspecified: Secondary | ICD-10-CM | POA: Diagnosis not present

## 2015-10-21 DIAGNOSIS — E6609 Other obesity due to excess calories: Secondary | ICD-10-CM | POA: Diagnosis not present

## 2015-10-26 DIAGNOSIS — R229 Localized swelling, mass and lump, unspecified: Secondary | ICD-10-CM | POA: Diagnosis not present

## 2015-11-04 DIAGNOSIS — R1909 Other intra-abdominal and pelvic swelling, mass and lump: Secondary | ICD-10-CM | POA: Diagnosis not present

## 2015-11-04 DIAGNOSIS — I10 Essential (primary) hypertension: Secondary | ICD-10-CM | POA: Diagnosis not present

## 2015-11-04 DIAGNOSIS — R109 Unspecified abdominal pain: Secondary | ICD-10-CM | POA: Diagnosis not present

## 2015-11-11 DIAGNOSIS — Z9049 Acquired absence of other specified parts of digestive tract: Secondary | ICD-10-CM | POA: Diagnosis not present

## 2015-11-11 DIAGNOSIS — E279 Disorder of adrenal gland, unspecified: Secondary | ICD-10-CM | POA: Diagnosis not present

## 2015-11-11 DIAGNOSIS — E278 Other specified disorders of adrenal gland: Secondary | ICD-10-CM | POA: Diagnosis not present

## 2015-11-17 DIAGNOSIS — Z6836 Body mass index (BMI) 36.0-36.9, adult: Secondary | ICD-10-CM | POA: Diagnosis not present

## 2015-11-17 DIAGNOSIS — I10 Essential (primary) hypertension: Secondary | ICD-10-CM | POA: Diagnosis not present

## 2015-11-17 DIAGNOSIS — R7301 Impaired fasting glucose: Secondary | ICD-10-CM | POA: Diagnosis not present

## 2015-11-18 DIAGNOSIS — Z91040 Latex allergy status: Secondary | ICD-10-CM | POA: Diagnosis not present

## 2015-11-18 DIAGNOSIS — I10 Essential (primary) hypertension: Secondary | ICD-10-CM | POA: Diagnosis not present

## 2015-11-18 DIAGNOSIS — Z713 Dietary counseling and surveillance: Secondary | ICD-10-CM | POA: Diagnosis not present

## 2015-11-18 DIAGNOSIS — Z6838 Body mass index (BMI) 38.0-38.9, adult: Secondary | ICD-10-CM | POA: Diagnosis not present

## 2015-11-18 DIAGNOSIS — E6609 Other obesity due to excess calories: Secondary | ICD-10-CM | POA: Diagnosis not present

## 2015-12-15 DIAGNOSIS — R7301 Impaired fasting glucose: Secondary | ICD-10-CM | POA: Diagnosis not present

## 2015-12-15 DIAGNOSIS — I10 Essential (primary) hypertension: Secondary | ICD-10-CM | POA: Diagnosis not present

## 2015-12-15 DIAGNOSIS — Z6836 Body mass index (BMI) 36.0-36.9, adult: Secondary | ICD-10-CM | POA: Diagnosis not present

## 2015-12-16 DIAGNOSIS — R1909 Other intra-abdominal and pelvic swelling, mass and lump: Secondary | ICD-10-CM | POA: Diagnosis not present

## 2015-12-16 DIAGNOSIS — R109 Unspecified abdominal pain: Secondary | ICD-10-CM | POA: Diagnosis not present

## 2015-12-16 DIAGNOSIS — D171 Benign lipomatous neoplasm of skin and subcutaneous tissue of trunk: Secondary | ICD-10-CM | POA: Diagnosis not present

## 2015-12-16 DIAGNOSIS — D216 Benign neoplasm of connective and other soft tissue of trunk, unspecified: Secondary | ICD-10-CM | POA: Diagnosis not present

## 2015-12-21 DIAGNOSIS — M545 Low back pain: Secondary | ICD-10-CM | POA: Diagnosis not present

## 2015-12-21 DIAGNOSIS — E279 Disorder of adrenal gland, unspecified: Secondary | ICD-10-CM | POA: Diagnosis not present

## 2015-12-21 DIAGNOSIS — R7301 Impaired fasting glucose: Secondary | ICD-10-CM | POA: Diagnosis not present

## 2015-12-24 DIAGNOSIS — R7301 Impaired fasting glucose: Secondary | ICD-10-CM | POA: Diagnosis not present

## 2015-12-24 DIAGNOSIS — E279 Disorder of adrenal gland, unspecified: Secondary | ICD-10-CM | POA: Diagnosis not present

## 2016-01-06 DIAGNOSIS — R7301 Impaired fasting glucose: Secondary | ICD-10-CM | POA: Diagnosis not present

## 2016-01-06 DIAGNOSIS — E559 Vitamin D deficiency, unspecified: Secondary | ICD-10-CM | POA: Diagnosis not present

## 2016-01-07 DIAGNOSIS — M5127 Other intervertebral disc displacement, lumbosacral region: Secondary | ICD-10-CM | POA: Diagnosis not present

## 2016-01-17 DIAGNOSIS — D4989 Neoplasm of unspecified behavior of other specified sites: Secondary | ICD-10-CM | POA: Insufficient documentation

## 2016-01-17 DIAGNOSIS — D214 Benign neoplasm of connective and other soft tissue of abdomen: Secondary | ICD-10-CM | POA: Diagnosis not present

## 2016-01-17 DIAGNOSIS — D487 Neoplasm of uncertain behavior of other specified sites: Secondary | ICD-10-CM | POA: Diagnosis not present

## 2016-01-17 DIAGNOSIS — D171 Benign lipomatous neoplasm of skin and subcutaneous tissue of trunk: Secondary | ICD-10-CM | POA: Diagnosis not present

## 2016-01-17 DIAGNOSIS — D179 Benign lipomatous neoplasm, unspecified: Secondary | ICD-10-CM | POA: Diagnosis not present

## 2016-01-17 DIAGNOSIS — D170 Benign lipomatous neoplasm of skin and subcutaneous tissue of head, face and neck: Secondary | ICD-10-CM | POA: Diagnosis not present

## 2016-01-17 DIAGNOSIS — R109 Unspecified abdominal pain: Secondary | ICD-10-CM | POA: Diagnosis not present

## 2016-01-17 HISTORY — DX: Neoplasm of unspecified behavior of other specified sites: D49.89

## 2016-01-29 DIAGNOSIS — Z87891 Personal history of nicotine dependence: Secondary | ICD-10-CM | POA: Diagnosis not present

## 2016-01-29 DIAGNOSIS — Z79899 Other long term (current) drug therapy: Secondary | ICD-10-CM | POA: Diagnosis not present

## 2016-01-29 DIAGNOSIS — T814XXA Infection following a procedure, initial encounter: Secondary | ICD-10-CM | POA: Diagnosis not present

## 2016-01-29 DIAGNOSIS — K219 Gastro-esophageal reflux disease without esophagitis: Secondary | ICD-10-CM | POA: Diagnosis not present

## 2016-01-29 DIAGNOSIS — Z981 Arthrodesis status: Secondary | ICD-10-CM | POA: Diagnosis not present

## 2016-01-29 DIAGNOSIS — K760 Fatty (change of) liver, not elsewhere classified: Secondary | ICD-10-CM | POA: Diagnosis not present

## 2016-01-29 DIAGNOSIS — G8918 Other acute postprocedural pain: Secondary | ICD-10-CM | POA: Diagnosis not present

## 2016-01-29 DIAGNOSIS — Z889 Allergy status to unspecified drugs, medicaments and biological substances status: Secondary | ICD-10-CM | POA: Diagnosis not present

## 2016-01-29 DIAGNOSIS — I10 Essential (primary) hypertension: Secondary | ICD-10-CM | POA: Diagnosis not present

## 2016-01-29 DIAGNOSIS — F419 Anxiety disorder, unspecified: Secondary | ICD-10-CM | POA: Diagnosis not present

## 2016-01-29 DIAGNOSIS — Z6837 Body mass index (BMI) 37.0-37.9, adult: Secondary | ICD-10-CM | POA: Diagnosis not present

## 2016-02-07 DIAGNOSIS — Z6839 Body mass index (BMI) 39.0-39.9, adult: Secondary | ICD-10-CM | POA: Diagnosis not present

## 2016-02-07 DIAGNOSIS — M549 Dorsalgia, unspecified: Secondary | ICD-10-CM | POA: Diagnosis not present

## 2016-02-07 DIAGNOSIS — M5126 Other intervertebral disc displacement, lumbar region: Secondary | ICD-10-CM | POA: Diagnosis not present

## 2016-02-09 DIAGNOSIS — R7301 Impaired fasting glucose: Secondary | ICD-10-CM | POA: Diagnosis not present

## 2016-02-09 DIAGNOSIS — I10 Essential (primary) hypertension: Secondary | ICD-10-CM | POA: Diagnosis not present

## 2016-02-09 DIAGNOSIS — Z6836 Body mass index (BMI) 36.0-36.9, adult: Secondary | ICD-10-CM | POA: Diagnosis not present

## 2016-02-18 DIAGNOSIS — I10 Essential (primary) hypertension: Secondary | ICD-10-CM | POA: Diagnosis not present

## 2016-02-18 DIAGNOSIS — R748 Abnormal levels of other serum enzymes: Secondary | ICD-10-CM | POA: Diagnosis not present

## 2016-02-18 DIAGNOSIS — N390 Urinary tract infection, site not specified: Secondary | ICD-10-CM | POA: Diagnosis not present

## 2016-02-18 DIAGNOSIS — H539 Unspecified visual disturbance: Secondary | ICD-10-CM | POA: Diagnosis not present

## 2016-02-18 DIAGNOSIS — F5101 Primary insomnia: Secondary | ICD-10-CM | POA: Diagnosis not present

## 2016-02-24 DIAGNOSIS — Z6839 Body mass index (BMI) 39.0-39.9, adult: Secondary | ICD-10-CM | POA: Diagnosis not present

## 2016-02-24 DIAGNOSIS — M5416 Radiculopathy, lumbar region: Secondary | ICD-10-CM | POA: Diagnosis not present

## 2016-02-24 DIAGNOSIS — M5126 Other intervertebral disc displacement, lumbar region: Secondary | ICD-10-CM | POA: Diagnosis not present

## 2016-02-24 DIAGNOSIS — E1065 Type 1 diabetes mellitus with hyperglycemia: Secondary | ICD-10-CM | POA: Diagnosis not present

## 2016-03-01 DIAGNOSIS — Z79899 Other long term (current) drug therapy: Secondary | ICD-10-CM | POA: Diagnosis not present

## 2016-03-02 DIAGNOSIS — R51 Headache: Secondary | ICD-10-CM | POA: Diagnosis not present

## 2016-03-04 DIAGNOSIS — J0100 Acute maxillary sinusitis, unspecified: Secondary | ICD-10-CM | POA: Diagnosis not present

## 2016-03-04 DIAGNOSIS — R05 Cough: Secondary | ICD-10-CM | POA: Diagnosis not present

## 2016-03-06 DIAGNOSIS — H6591 Unspecified nonsuppurative otitis media, right ear: Secondary | ICD-10-CM | POA: Diagnosis not present

## 2016-03-06 DIAGNOSIS — R509 Fever, unspecified: Secondary | ICD-10-CM | POA: Diagnosis not present

## 2016-03-06 DIAGNOSIS — J029 Acute pharyngitis, unspecified: Secondary | ICD-10-CM | POA: Diagnosis not present

## 2016-03-15 DIAGNOSIS — E119 Type 2 diabetes mellitus without complications: Secondary | ICD-10-CM

## 2016-03-15 DIAGNOSIS — I10 Essential (primary) hypertension: Secondary | ICD-10-CM

## 2016-03-15 DIAGNOSIS — E669 Obesity, unspecified: Secondary | ICD-10-CM

## 2016-03-15 DIAGNOSIS — K219 Gastro-esophageal reflux disease without esophagitis: Secondary | ICD-10-CM

## 2016-03-15 DIAGNOSIS — R0683 Snoring: Secondary | ICD-10-CM

## 2016-03-15 DIAGNOSIS — Z6839 Body mass index (BMI) 39.0-39.9, adult: Secondary | ICD-10-CM | POA: Insufficient documentation

## 2016-03-15 HISTORY — DX: Essential (primary) hypertension: I10

## 2016-03-15 HISTORY — DX: Snoring: R06.83

## 2016-03-15 HISTORY — DX: Gastro-esophageal reflux disease without esophagitis: K21.9

## 2016-03-15 HISTORY — DX: Type 2 diabetes mellitus without complications: E11.9

## 2016-03-15 HISTORY — DX: Obesity, unspecified: E66.9

## 2016-03-20 DIAGNOSIS — R7301 Impaired fasting glucose: Secondary | ICD-10-CM | POA: Diagnosis not present

## 2016-03-23 DIAGNOSIS — Z6841 Body Mass Index (BMI) 40.0 and over, adult: Secondary | ICD-10-CM | POA: Diagnosis not present

## 2016-03-23 DIAGNOSIS — M5126 Other intervertebral disc displacement, lumbar region: Secondary | ICD-10-CM | POA: Diagnosis not present

## 2016-03-23 DIAGNOSIS — M5416 Radiculopathy, lumbar region: Secondary | ICD-10-CM | POA: Diagnosis not present

## 2016-04-04 DIAGNOSIS — E669 Obesity, unspecified: Secondary | ICD-10-CM | POA: Diagnosis not present

## 2016-04-04 DIAGNOSIS — I10 Essential (primary) hypertension: Secondary | ICD-10-CM | POA: Diagnosis not present

## 2016-04-04 DIAGNOSIS — Z136 Encounter for screening for cardiovascular disorders: Secondary | ICD-10-CM | POA: Diagnosis not present

## 2016-04-04 DIAGNOSIS — E559 Vitamin D deficiency, unspecified: Secondary | ICD-10-CM | POA: Diagnosis not present

## 2016-04-04 DIAGNOSIS — Z6838 Body mass index (BMI) 38.0-38.9, adult: Secondary | ICD-10-CM | POA: Diagnosis not present

## 2016-04-04 DIAGNOSIS — E119 Type 2 diabetes mellitus without complications: Secondary | ICD-10-CM | POA: Diagnosis not present

## 2016-04-04 DIAGNOSIS — R5383 Other fatigue: Secondary | ICD-10-CM | POA: Diagnosis not present

## 2016-04-10 DIAGNOSIS — R0789 Other chest pain: Secondary | ICD-10-CM | POA: Diagnosis not present

## 2016-04-10 DIAGNOSIS — R0602 Shortness of breath: Secondary | ICD-10-CM | POA: Diagnosis not present

## 2016-04-10 DIAGNOSIS — Z87891 Personal history of nicotine dependence: Secondary | ICD-10-CM | POA: Diagnosis not present

## 2016-04-10 DIAGNOSIS — E119 Type 2 diabetes mellitus without complications: Secondary | ICD-10-CM | POA: Diagnosis not present

## 2016-04-10 DIAGNOSIS — I249 Acute ischemic heart disease, unspecified: Secondary | ICD-10-CM | POA: Diagnosis not present

## 2016-04-10 DIAGNOSIS — R079 Chest pain, unspecified: Secondary | ICD-10-CM | POA: Diagnosis not present

## 2016-04-10 DIAGNOSIS — Z79899 Other long term (current) drug therapy: Secondary | ICD-10-CM | POA: Diagnosis not present

## 2016-04-10 DIAGNOSIS — I209 Angina pectoris, unspecified: Secondary | ICD-10-CM | POA: Diagnosis not present

## 2016-04-10 DIAGNOSIS — K219 Gastro-esophageal reflux disease without esophagitis: Secondary | ICD-10-CM | POA: Diagnosis not present

## 2016-04-10 DIAGNOSIS — Z6838 Body mass index (BMI) 38.0-38.9, adult: Secondary | ICD-10-CM | POA: Diagnosis not present

## 2016-04-10 DIAGNOSIS — G8929 Other chronic pain: Secondary | ICD-10-CM | POA: Diagnosis not present

## 2016-04-10 DIAGNOSIS — E65 Localized adiposity: Secondary | ICD-10-CM

## 2016-04-10 DIAGNOSIS — M549 Dorsalgia, unspecified: Secondary | ICD-10-CM | POA: Diagnosis not present

## 2016-04-11 DIAGNOSIS — M549 Dorsalgia, unspecified: Secondary | ICD-10-CM | POA: Diagnosis not present

## 2016-04-11 DIAGNOSIS — R0602 Shortness of breath: Secondary | ICD-10-CM | POA: Diagnosis not present

## 2016-04-11 DIAGNOSIS — E119 Type 2 diabetes mellitus without complications: Secondary | ICD-10-CM | POA: Diagnosis not present

## 2016-04-11 DIAGNOSIS — R079 Chest pain, unspecified: Secondary | ICD-10-CM | POA: Diagnosis not present

## 2016-04-15 DIAGNOSIS — N83202 Unspecified ovarian cyst, left side: Secondary | ICD-10-CM | POA: Diagnosis not present

## 2016-04-15 DIAGNOSIS — E282 Polycystic ovarian syndrome: Secondary | ICD-10-CM | POA: Diagnosis not present

## 2016-04-17 DIAGNOSIS — Z713 Dietary counseling and surveillance: Secondary | ICD-10-CM | POA: Diagnosis not present

## 2016-04-17 DIAGNOSIS — E119 Type 2 diabetes mellitus without complications: Secondary | ICD-10-CM | POA: Diagnosis not present

## 2016-04-17 DIAGNOSIS — I10 Essential (primary) hypertension: Secondary | ICD-10-CM | POA: Diagnosis not present

## 2016-04-17 DIAGNOSIS — E669 Obesity, unspecified: Secondary | ICD-10-CM | POA: Diagnosis not present

## 2016-04-18 DIAGNOSIS — G43909 Migraine, unspecified, not intractable, without status migrainosus: Secondary | ICD-10-CM | POA: Diagnosis not present

## 2016-04-18 DIAGNOSIS — N83202 Unspecified ovarian cyst, left side: Secondary | ICD-10-CM | POA: Diagnosis not present

## 2016-04-18 DIAGNOSIS — I10 Essential (primary) hypertension: Secondary | ICD-10-CM | POA: Diagnosis not present

## 2016-04-18 DIAGNOSIS — E282 Polycystic ovarian syndrome: Secondary | ICD-10-CM | POA: Diagnosis not present

## 2016-04-21 DIAGNOSIS — R51 Headache: Secondary | ICD-10-CM | POA: Diagnosis not present

## 2016-05-01 DIAGNOSIS — Z1151 Encounter for screening for human papillomavirus (HPV): Secondary | ICD-10-CM | POA: Diagnosis not present

## 2016-05-01 DIAGNOSIS — Z01419 Encounter for gynecological examination (general) (routine) without abnormal findings: Secondary | ICD-10-CM | POA: Diagnosis not present

## 2016-05-01 DIAGNOSIS — N83209 Unspecified ovarian cyst, unspecified side: Secondary | ICD-10-CM | POA: Diagnosis not present

## 2016-05-01 DIAGNOSIS — Z1272 Encounter for screening for malignant neoplasm of vagina: Secondary | ICD-10-CM | POA: Diagnosis not present

## 2016-05-01 DIAGNOSIS — Z1389 Encounter for screening for other disorder: Secondary | ICD-10-CM | POA: Diagnosis not present

## 2016-05-01 DIAGNOSIS — Z130 Encounter for screening for diseases of the blood and blood-forming organs and certain disorders involving the immune mechanism: Secondary | ICD-10-CM | POA: Diagnosis not present

## 2016-05-08 DIAGNOSIS — Z79899 Other long term (current) drug therapy: Secondary | ICD-10-CM | POA: Diagnosis not present

## 2016-05-08 DIAGNOSIS — I10 Essential (primary) hypertension: Secondary | ICD-10-CM | POA: Diagnosis not present

## 2016-05-08 DIAGNOSIS — E559 Vitamin D deficiency, unspecified: Secondary | ICD-10-CM | POA: Diagnosis not present

## 2016-05-08 DIAGNOSIS — E282 Polycystic ovarian syndrome: Secondary | ICD-10-CM | POA: Diagnosis not present

## 2016-05-08 DIAGNOSIS — G43909 Migraine, unspecified, not intractable, without status migrainosus: Secondary | ICD-10-CM | POA: Diagnosis not present

## 2016-06-17 DIAGNOSIS — J0300 Acute streptococcal tonsillitis, unspecified: Secondary | ICD-10-CM | POA: Diagnosis not present

## 2016-08-08 DIAGNOSIS — E559 Vitamin D deficiency, unspecified: Secondary | ICD-10-CM | POA: Diagnosis not present

## 2016-08-08 DIAGNOSIS — R7301 Impaired fasting glucose: Secondary | ICD-10-CM | POA: Diagnosis not present

## 2016-08-08 DIAGNOSIS — I10 Essential (primary) hypertension: Secondary | ICD-10-CM | POA: Diagnosis not present

## 2016-08-08 DIAGNOSIS — E282 Polycystic ovarian syndrome: Secondary | ICD-10-CM | POA: Diagnosis not present

## 2016-08-08 DIAGNOSIS — F5101 Primary insomnia: Secondary | ICD-10-CM | POA: Diagnosis not present

## 2016-08-08 DIAGNOSIS — Z79899 Other long term (current) drug therapy: Secondary | ICD-10-CM | POA: Diagnosis not present

## 2016-09-18 DIAGNOSIS — I10 Essential (primary) hypertension: Secondary | ICD-10-CM | POA: Diagnosis not present

## 2016-09-18 DIAGNOSIS — E559 Vitamin D deficiency, unspecified: Secondary | ICD-10-CM | POA: Diagnosis not present

## 2016-09-18 DIAGNOSIS — G43909 Migraine, unspecified, not intractable, without status migrainosus: Secondary | ICD-10-CM | POA: Diagnosis not present

## 2016-09-18 DIAGNOSIS — R413 Other amnesia: Secondary | ICD-10-CM | POA: Diagnosis not present

## 2016-09-18 DIAGNOSIS — E282 Polycystic ovarian syndrome: Secondary | ICD-10-CM | POA: Diagnosis not present

## 2016-09-18 DIAGNOSIS — L853 Xerosis cutis: Secondary | ICD-10-CM | POA: Diagnosis not present

## 2016-09-25 DIAGNOSIS — E041 Nontoxic single thyroid nodule: Secondary | ICD-10-CM | POA: Diagnosis not present

## 2016-09-27 DIAGNOSIS — K0889 Other specified disorders of teeth and supporting structures: Secondary | ICD-10-CM | POA: Diagnosis not present

## 2016-10-13 DIAGNOSIS — E041 Nontoxic single thyroid nodule: Secondary | ICD-10-CM | POA: Diagnosis not present

## 2016-11-03 DIAGNOSIS — Z1231 Encounter for screening mammogram for malignant neoplasm of breast: Secondary | ICD-10-CM | POA: Diagnosis not present

## 2016-11-14 ENCOUNTER — Other Ambulatory Visit: Payer: Self-pay | Admitting: Obstetrics and Gynecology

## 2016-11-14 DIAGNOSIS — N6489 Other specified disorders of breast: Secondary | ICD-10-CM

## 2016-11-14 DIAGNOSIS — R2232 Localized swelling, mass and lump, left upper limb: Secondary | ICD-10-CM

## 2016-11-20 ENCOUNTER — Ambulatory Visit
Admission: RE | Admit: 2016-11-20 | Discharge: 2016-11-20 | Disposition: A | Discharge status: Home / Self Care | Payer: BLUE CROSS/BLUE SHIELD | Source: Ambulatory Visit | Attending: Obstetrics and Gynecology | Admitting: Obstetrics and Gynecology

## 2016-11-20 DIAGNOSIS — N6489 Other specified disorders of breast: Secondary | ICD-10-CM

## 2016-11-20 DIAGNOSIS — R2232 Localized swelling, mass and lump, left upper limb: Secondary | ICD-10-CM

## 2016-11-20 DIAGNOSIS — R928 Other abnormal and inconclusive findings on diagnostic imaging of breast: Secondary | ICD-10-CM | POA: Diagnosis not present

## 2017-03-07 DIAGNOSIS — R062 Wheezing: Secondary | ICD-10-CM | POA: Diagnosis not present

## 2017-03-07 DIAGNOSIS — J180 Bronchopneumonia, unspecified organism: Secondary | ICD-10-CM | POA: Diagnosis not present

## 2017-03-07 DIAGNOSIS — R05 Cough: Secondary | ICD-10-CM | POA: Diagnosis not present

## 2017-03-07 DIAGNOSIS — R34 Anuria and oliguria: Secondary | ICD-10-CM | POA: Diagnosis not present

## 2017-05-08 DIAGNOSIS — R102 Pelvic and perineal pain: Secondary | ICD-10-CM | POA: Diagnosis not present

## 2017-05-08 DIAGNOSIS — R1032 Left lower quadrant pain: Secondary | ICD-10-CM | POA: Diagnosis not present

## 2017-05-16 DIAGNOSIS — R1032 Left lower quadrant pain: Secondary | ICD-10-CM | POA: Diagnosis not present

## 2017-05-16 DIAGNOSIS — N83202 Unspecified ovarian cyst, left side: Secondary | ICD-10-CM | POA: Diagnosis not present

## 2017-05-23 DIAGNOSIS — N83209 Unspecified ovarian cyst, unspecified side: Secondary | ICD-10-CM | POA: Diagnosis not present

## 2017-07-03 DIAGNOSIS — R079 Chest pain, unspecified: Secondary | ICD-10-CM | POA: Diagnosis not present

## 2017-07-03 DIAGNOSIS — R42 Dizziness and giddiness: Secondary | ICD-10-CM | POA: Diagnosis not present

## 2017-07-03 DIAGNOSIS — Z87891 Personal history of nicotine dependence: Secondary | ICD-10-CM | POA: Diagnosis not present

## 2017-07-03 DIAGNOSIS — R109 Unspecified abdominal pain: Secondary | ICD-10-CM | POA: Diagnosis not present

## 2017-07-03 DIAGNOSIS — M542 Cervicalgia: Secondary | ICD-10-CM | POA: Diagnosis not present

## 2017-07-03 DIAGNOSIS — I10 Essential (primary) hypertension: Secondary | ICD-10-CM | POA: Diagnosis not present

## 2017-07-03 DIAGNOSIS — R0781 Pleurodynia: Secondary | ICD-10-CM | POA: Diagnosis not present

## 2017-07-03 DIAGNOSIS — Z7984 Long term (current) use of oral hypoglycemic drugs: Secondary | ICD-10-CM | POA: Diagnosis not present

## 2017-07-03 DIAGNOSIS — R739 Hyperglycemia, unspecified: Secondary | ICD-10-CM | POA: Diagnosis not present

## 2017-07-03 DIAGNOSIS — Z79899 Other long term (current) drug therapy: Secondary | ICD-10-CM | POA: Diagnosis not present

## 2017-07-03 DIAGNOSIS — Z981 Arthrodesis status: Secondary | ICD-10-CM | POA: Diagnosis not present

## 2017-07-03 DIAGNOSIS — K219 Gastro-esophageal reflux disease without esophagitis: Secondary | ICD-10-CM | POA: Diagnosis not present

## 2017-08-14 DIAGNOSIS — L821 Other seborrheic keratosis: Secondary | ICD-10-CM | POA: Diagnosis not present

## 2017-08-14 DIAGNOSIS — L578 Other skin changes due to chronic exposure to nonionizing radiation: Secondary | ICD-10-CM | POA: Diagnosis not present

## 2017-08-14 DIAGNOSIS — D224 Melanocytic nevi of scalp and neck: Secondary | ICD-10-CM | POA: Diagnosis not present

## 2017-10-29 DIAGNOSIS — Z86018 Personal history of other benign neoplasm: Secondary | ICD-10-CM | POA: Diagnosis not present

## 2017-10-29 DIAGNOSIS — I10 Essential (primary) hypertension: Secondary | ICD-10-CM | POA: Diagnosis not present

## 2017-10-29 DIAGNOSIS — Z6841 Body Mass Index (BMI) 40.0 and over, adult: Secondary | ICD-10-CM | POA: Diagnosis not present

## 2017-10-29 DIAGNOSIS — E249 Cushing's syndrome, unspecified: Secondary | ICD-10-CM | POA: Diagnosis not present

## 2017-10-29 DIAGNOSIS — E669 Obesity, unspecified: Secondary | ICD-10-CM | POA: Diagnosis not present

## 2017-10-29 DIAGNOSIS — E119 Type 2 diabetes mellitus without complications: Secondary | ICD-10-CM | POA: Diagnosis not present

## 2017-10-29 DIAGNOSIS — Z90711 Acquired absence of uterus with remaining cervical stump: Secondary | ICD-10-CM | POA: Diagnosis not present

## 2017-10-29 DIAGNOSIS — K219 Gastro-esophageal reflux disease without esophagitis: Secondary | ICD-10-CM | POA: Diagnosis not present

## 2017-10-29 DIAGNOSIS — Z87891 Personal history of nicotine dependence: Secondary | ICD-10-CM | POA: Diagnosis not present

## 2017-10-29 DIAGNOSIS — E8881 Metabolic syndrome: Secondary | ICD-10-CM | POA: Diagnosis not present

## 2017-10-29 DIAGNOSIS — Z79899 Other long term (current) drug therapy: Secondary | ICD-10-CM | POA: Diagnosis not present

## 2017-11-03 DIAGNOSIS — E249 Cushing's syndrome, unspecified: Secondary | ICD-10-CM | POA: Diagnosis not present

## 2017-11-04 DIAGNOSIS — E249 Cushing's syndrome, unspecified: Secondary | ICD-10-CM | POA: Diagnosis not present

## 2017-11-05 DIAGNOSIS — E249 Cushing's syndrome, unspecified: Secondary | ICD-10-CM | POA: Diagnosis not present

## 2017-11-06 DIAGNOSIS — E249 Cushing's syndrome, unspecified: Secondary | ICD-10-CM | POA: Diagnosis not present

## 2017-11-06 DIAGNOSIS — I10 Essential (primary) hypertension: Secondary | ICD-10-CM | POA: Diagnosis not present

## 2017-11-06 DIAGNOSIS — E119 Type 2 diabetes mellitus without complications: Secondary | ICD-10-CM | POA: Diagnosis not present

## 2017-11-08 DIAGNOSIS — I10 Essential (primary) hypertension: Secondary | ICD-10-CM | POA: Diagnosis not present

## 2017-11-08 DIAGNOSIS — E119 Type 2 diabetes mellitus without complications: Secondary | ICD-10-CM | POA: Diagnosis not present

## 2017-11-17 DIAGNOSIS — E119 Type 2 diabetes mellitus without complications: Secondary | ICD-10-CM | POA: Diagnosis not present

## 2017-11-17 DIAGNOSIS — I10 Essential (primary) hypertension: Secondary | ICD-10-CM | POA: Diagnosis not present

## 2017-11-28 DIAGNOSIS — E559 Vitamin D deficiency, unspecified: Secondary | ICD-10-CM

## 2017-11-28 DIAGNOSIS — R5383 Other fatigue: Secondary | ICD-10-CM

## 2017-11-28 HISTORY — DX: Other fatigue: R53.83

## 2017-11-28 HISTORY — DX: Vitamin D deficiency, unspecified: E55.9

## 2017-12-17 ENCOUNTER — Encounter: Payer: Self-pay | Admitting: Cardiology

## 2017-12-17 NOTE — Progress Notes (Signed)
Cardiology Office Note:    Date:  12/18/2017   ID:  Deborah Sheppard, DOB 1975-12-10, MRN [id number]  PCP:  Sarajane Jews, FNP  Cardiologist:  Shirlee More, MD   Referring MD: Newton Pigg, MD  ASSESSMENT:    1. Essential hypertension   2. Type 2 diabetes mellitus without complication, without long-term current use of insulin ([place])   3. History of chest pain    PLAN:    In order of problems listed above:  1. She has poorly controlled hypertension on current treatment.  For further evaluation showed an aldosterone to renal level and renal vascular duplex performed.  I switched her to a more potent ARB thiazide diuretic combination and she remains above target we will add a calcium channel blocker. 2. From a cardiovascular perspective I recommended treatment with either tradename Glucophage Victoza or SGL T2 inhibitor.  She will discuss with endocrinology 3. I reviewed her records with her she did not have acute coronary syndrome she had a normal myocardial perfusion study there is been no recurrence at this time I would not pursue a repeat ischemic evaluation.  Next appointment 6 weeks   Medication Adjustments/Labs and Tests Ordered: Current medicines are reviewed at length with the patient today.  Concerns regarding medicines are outlined above.  Orders Placed This Encounter  Procedures  . Basic Metabolic Panel (BMET)  . Aldosterone + renin activity w/ ratio  . EKG 12-Lead   Meds ordered this encounter  Medications  . telmisartan-hydrochlorothiazide (MICARDIS HCT) 40-12.5 MG tablet    Sig: Take 1 tablet by mouth daily.    Dispense:  30 tablet    Refill:  3     Chief Complaint  Patient presents with  . New Patient (Initial Visit)  . Hypertension    History of Present Illness:    Deborah Sheppard is a 42 y.o. female with T2 DM who is being seen today for the evaluation of hypertension at the request of Newton Pigg, MD. She is on an ACEI started by her PCP.   She is  concerned regarding home blood pressure that runs in the range of 170-200/104-120 on her current ACE.  She has no history of renal disease or sleep apnea she restriction daily sodium does not abuse alcohol and is compliant with her medications.  She has type 2 diabetes and is awaiting a change in treatment as she cannot tolerate generic metformin with GI symptoms.  From a cardiology perspective I recommended trade Glucophage XR, Victoza or SGLT2 inhibitors.  I am awaiting records from [facility] she tells me 1 to 2 years ago she had chest pain was admitted she had a myocardial perfusion study records to become available it was normal and CT of the chest that showed no evidence of vascular calcification she is concerned that she was told she had acute coronary syndrome but I reviewed the discharge summary her EKGs and cardiac enzymes were normal and she had nonischemic chest pain there is been no recurrence shortness of breath palpitation or syncope.  Her lipid profile shows elevated triglycerides but is not on lipid-lowering therapy  Past Medical History:  Diagnosis Date  . Essential hypertension 03/15/2016  . Fatigue 11/28/2017  . Fatty liver   . Gastroesophageal reflux disease without esophagitis 03/15/2016  . GERD (gastroesophageal reflux disease)   . 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 Obesity (BMI 30-39.9) 03/15/2016  . Peripheral edema   . Snoring 03/15/2016  . Type 2 diabetes mellitus without complication,  without long-term current use of insulin ([place]) 03/15/2016  . Vitamin D deficiency 11/28/2017    Past Surgical History:  Procedure Laterality Date  . ABDOMINAL HYSTERECTOMY    . CERVICAL SPINE SURGERY    . CHOLECYSTECTOMY    . LIPOMA EXCISION    . MYRINGOTOMY WITH TUBE PLACEMENT    . TONSILLECTOMY AND ADENOIDECTOMY      Current Medications: Current Meds  Medication Sig  . butalbital-acetaminophen-caffeine (FIORICET/CODEINE) 50-325-40-30 MG capsule Take 1 capsule by mouth every 4  (four) hours as needed for headache.  Marland Kitchen EPINEPHrine (EPIPEN 2-PAK) 0.3 mg/0.3 mL IJ SOAJ injection   . ondansetron (ZOFRAN) 8 MG tablet Take 8 mg by mouth as needed.  . Vitamin D, Ergocalciferol, (DRISDOL) 50000 units CAPS capsule Take 50,000 Units by mouth every 7 (seven) days.  . [DISCONTINUED] lisinopril (PRINIVIL,ZESTRIL) 20 MG tablet Take 20 mg by mouth daily.     Allergies:   Sumatriptan; Clarithromycin; Latex; Ceftriaxone; Metformin and related; Sumatriptan succinate; and Gabapentin   Social History   Socioeconomic History  . Marital status: Divorced    Spouse name: Not on file  . Number of children: Not on file  . Years of education: Not on file  . Highest education level: Not on file  Occupational History  . Not on file  Social Needs  . Financial resource strain: Not on file  . Food insecurity:    Worry: Not on file    Inability: Not on file  . Transportation needs:    Medical: Not on file    Non-medical: Not on file  Tobacco Use  . Smoking status: Former Smoker    Packs/day: 1.00    Years: 8.00    Pack years: 8.00    Last attempt to quit: 01/2011    Years since quitting: 6.9  . Smokeless tobacco: Never Used  Substance and Sexual Activity  . Alcohol use: Yes    Comment: rarely  . Drug use: Never  . Sexual activity: Not on file  Lifestyle  . Physical activity:    Days per week: Not on file    Minutes per session: Not on file  . Stress: Not on file  Relationships  . Social connections:    Talks on phone: Not on file    Gets together: Not on file    Attends religious service: Not on file    Active member of club or organization: Not on file    Attends meetings of clubs or organizations: Not on file    Relationship status: Not on file  Other Topics Concern  . Not on file  Social History Narrative  . Not on file     Family History: The patient's family history includes Diabetes in her mother; Heart attack in her paternal aunt; Hyperlipidemia in her  mother; Hypertension in her mother; Migraines in her sister.  ROS:   ROS Please see the history of present illness.     All other systems reviewed and are negative.  EKGs/Labs/Other Studies Reviewed:    The following studies were reviewed today:   EKG:  EKG is  ordered today.  The ekg ordered today demonstrates sinus rhythm and is normal  Recent Labs: 11/08/2017 CMP normal except for glucose of 162 potassium 4.4 creatinine 0.6 lipid profile cholesterol 154 HDL 41 LDL 65 hemoglobin A1c 8.0 CBC normal No results found for requested labs within last 8760 hours.  Recent Lipid Panel 03/25/16 Chol 180 HDL 54 LDL 96 No results found for: CHOL,  TRIG, HDL, CHOLHDL, VLDL, LDLCALC, LDLDIRECT  Physical Exam:    VS:  BP (!) 164/108 (BP Location: Right Arm, Patient Position: Sitting, Cuff Size: Large)   Pulse 78   Ht 5\' 6"  (1.676 m)   Wt 247 lb 3.2 oz (112.1 kg)   SpO2 98%   BMI 39.90 kg/m     Wt Readings from Last 3 Encounters:  12/18/17 247 lb 3.2 oz (112.1 kg)  10/20/12 213 lb (96.6 kg)     GEN:  Well nourished, well developed in no acute distress HEENT: Normal NECK: No JVD; No carotid bruits LYMPHATICS: No lymphadenopathy CARDIAC: RRR, no murmurs, rubs, gallops RESPIRATORY:  Clear to auscultation without rales, wheezing or rhonchi  ABDOMEN: Soft, non-tender, non-distended MUSCULOSKELETAL:  No edema; No deformity  SKIN: Warm and dry NEUROLOGIC:  Alert and oriented x 3 PSYCHIATRIC:  Normal affect     Signed, Shirlee More, MD  12/18/2017 12:36 PM    [facility]

## 2017-12-18 ENCOUNTER — Encounter: Payer: Self-pay | Admitting: Cardiology

## 2017-12-18 ENCOUNTER — Ambulatory Visit (INDEPENDENT_AMBULATORY_CARE_PROVIDER_SITE_OTHER): Payer: BLUE CROSS/BLUE SHIELD | Admitting: Cardiology

## 2017-12-18 VITALS — BP 164/108 | HR 78 | Ht 66.0 in | Wt 247.2 lb

## 2017-12-18 DIAGNOSIS — I10 Essential (primary) hypertension: Secondary | ICD-10-CM | POA: Diagnosis not present

## 2017-12-18 DIAGNOSIS — Z87898 Personal history of other specified conditions: Secondary | ICD-10-CM | POA: Diagnosis not present

## 2017-12-18 DIAGNOSIS — E119 Type 2 diabetes mellitus without complications: Secondary | ICD-10-CM

## 2017-12-18 MED ORDER — TELMISARTAN-HCTZ 40-12.5 MG PO TABS: 1.0000 | ORAL_TABLET | Freq: Every day | ORAL | 3 refills | Status: DC

## 2017-12-18 NOTE — Patient Instructions (Addendum)
Medication Instructions:  Your physician has recommended you make the following change in your medication:  STOP lisinopril START telmisartan + hydrochlorothiazide 40/12.5 mg daily  Labwork: Your physician recommends that you have the following labs drawn: BMP and aldosterone renin  Testing/Procedures: Your physician has requested that you have a renal artery duplex. During this test, an ultrasound is used to evaluate blood flow to the kidneys. Allow one hour for this exam. Do not eat after midnight the day before and avoid carbonated beverages. Take your medications as you usually do.  Follow-Up: Your physician recommends that you schedule a follow-up appointment in: 6 weeks  Any Other Special Instructions Will Be Listed Below (If Applicable).  Consider using victoza or jardia or jardia glucophage XR combination   If you need a refill on your cardiac medications before your next appointment, please call your pharmacy.   Jackson, RN, BSN  Hypertension Hypertension is another name for high blood pressure. High blood pressure forces your heart to work harder to pump blood. This can cause problems over time. There are two numbers in a blood pressure reading. There is a top number (systolic) over a bottom number (diastolic). It is best to have a blood pressure below 120/80. Healthy choices can help lower your blood pressure. You may need medicine to help lower your blood pressure if:  Your blood pressure cannot be lowered with healthy choices.  Your blood pressure is higher than 130/80.  Follow these instructions at home: Eating and drinking  If directed, follow the DASH eating plan. This diet includes: ? Filling half of your plate at each meal with fruits and vegetables. ? Filling one quarter of your plate at each meal with whole grains. Whole grains include whole wheat pasta, brown rice, and whole grain bread. ? Eating or drinking low-fat dairy products, such  as skim milk or low-fat yogurt. ? Filling one quarter of your plate at each meal with low-fat (lean) proteins. Low-fat proteins include fish, skinless chicken, eggs, beans, and tofu. ? Avoiding fatty meat, cured and processed meat, or chicken with skin. ? Avoiding premade or processed food.  Eat less than 1,500 mg of salt (sodium) a day.  Limit alcohol use to no more than 1 drink a day for nonpregnant women and 2 drinks a day for men. One drink equals 12 oz of beer, 5 oz of wine, or 1 oz of hard liquor. Lifestyle  Work with your doctor to stay at a healthy weight or to lose weight. Ask your doctor what the best weight is for you.  Get at least 30 minutes of exercise that causes your heart to beat faster (aerobic exercise) most days of the week. This may include walking, swimming, or biking.  Get at least 30 minutes of exercise that strengthens your muscles (resistance exercise) at least 3 days a week. This may include lifting weights or pilates.  Do not use any products that contain nicotine or tobacco. This includes cigarettes and e-cigarettes. If you need help quitting, ask your doctor.  Check your blood pressure at home as told by your doctor.  Keep all follow-up visits as told by your doctor. This is important. Medicines  Take over-the-counter and prescription medicines only as told by your doctor. Follow directions carefully.  Do not skip doses of blood pressure medicine. The medicine does not work as well if you skip doses. Skipping doses also puts you at risk for problems.  Ask your doctor about side  effects or reactions to medicines that you should watch for. Contact a doctor if:  You think you are having a reaction to the medicine you are taking.  You have headaches that keep coming back (recurring).  You feel dizzy.  You have swelling in your ankles.  You have trouble with your vision. Get help right away if:  You get a very bad headache.  You start to feel  confused.  You feel weak or numb.  You feel faint.  You get very bad pain in your: ? Chest. ? Belly (abdomen).  You throw up (vomit) more than once.  You have trouble breathing. Summary  Hypertension is another name for high blood pressure.  Making healthy choices can help lower blood pressure. If your blood pressure cannot be controlled with healthy choices, you may need to take medicine. This information is not intended to replace advice given to you by your health care provider. Make sure you discuss any questions you have with your health care provider. Document Released: 11/15/2007 Document Revised: 04/26/2016 Document Reviewed: 04/26/2016 Elsevier Interactive Patient Education  2018 [place]: Healthy eating to lower your blood pressure The DASH diet emphasizes portion size, eating a variety of foods and getting the right amount of nutrients. Discover how DASH can improve your health and lower your blood pressure. By [facility] Staff  DASH stands for Dietary Approaches to Stop Hypertension. The DASH diet is a lifelong approach to healthy eating that's designed to help treat or prevent high blood pressure (hypertension). The DASH diet encourages you to reduce the sodium in your diet and eat a variety of foods rich in nutrients that help lower blood pressure, such as potassium, calcium and magnesium. By following the DASH diet, you may be able to reduce your blood pressure by a few points in just two weeks. Over time, your systolic blood pressure could drop by eight to 14 points, which can make a significant difference in your health risks. Because the DASH diet is a healthy way of eating, it offers health benefits besides just lowering blood pressure. The DASH diet is also in line with dietary recommendations to prevent osteoporosis, cancer, heart disease, stroke and diabetes. DASH diet: Sodium levels The DASH diet emphasizes vegetables, fruits and low-fat dairy  foods - and moderate amounts of whole grains, fish, poultry and nuts. In addition to the standard DASH diet, there is also a lower sodium version of the diet. You can choose the version of the diet that meets your health needs: Standard DASH diet. You can consume up to 2,300 milligrams (mg) of sodium a day.  Lower sodium DASH diet. You can consume up to 1,500 mg of sodium a day. Both versions of the DASH diet aim to reduce the amount of sodium in your diet compared with what you might get in a typical American diet, which can amount to a whopping 3,400 mg of sodium a day or more. The standard DASH diet meets the recommendation from the Dietary Guidelines for Americans to keep daily sodium intake to less than 2,300 mg a day. The American Heart Association recommends 1,500 mg a day of sodium as an upper limit for all adults. If you aren't sure what sodium level is right for you, talk to your doctor. DASH diet: What to eat Both versions of the DASH diet include lots of whole grains, fruits, vegetables and low-fat dairy products. The DASH diet also includes some fish, poultry and legumes, and encourages  a small amount of nuts and seeds a few times a week.  You can eat red meat, sweets and fats in small amounts. The DASH diet is low in saturated fat, cholesterol and total fat. Here's a look at the recommended servings from each food group for the 2,000-calorie-a-day DASH diet. Grains: 6 to 8 servings a day Grains include bread, cereal, rice and pasta. Examples of one serving of grains include 1 slice whole-wheat bread, 1 ounce dry cereal, or 1/2 cup cooked cereal, rice or pasta. Focus on whole grains because they have more fiber and nutrients than do refined grains. For instance, use brown rice instead of white rice, whole-wheat pasta instead of regular pasta and whole-grain bread instead of white bread. Look for products labeled "100 percent whole grain" or "100 percent whole wheat."  Grains are naturally  low in fat. Keep them this way by avoiding butter, cream and cheese sauces. Vegetables: 4 to 5 servings a day Tomatoes, carrots, broccoli, sweet potatoes, greens and other vegetables are full of fiber, vitamins, and such minerals as potassium and magnesium. Examples of one serving include 1 cup raw leafy green vegetables or 1/2 cup cut-up raw or cooked vegetables. Don't think of vegetables only as side dishes - a hearty blend of vegetables served over brown rice or whole-wheat noodles can serve as the main dish for a meal.  Fresh and frozen vegetables are both good choices. When buying frozen and canned vegetables, choose those labeled as low sodium or without added salt.  To increase the number of servings you fit in daily, be creative. In a stir-fry, for instance, cut the amount of meat in half and double up on the vegetables. Fruits: 4 to 5 servings a day Many fruits need little preparation to become a healthy part of a meal or snack. Like vegetables, they're packed with fiber, potassium and magnesium and are typically low in fat - coconuts are an exception. Examples of one serving include one medium fruit, 1/2 cup fresh, frozen or canned fruit, or 4 ounces of juice. Have a piece of fruit with meals and one as a snack, then round out your day with a dessert of fresh fruits topped with a dollop of low-fat yogurt.  Leave on edible peels whenever possible. The peels of apples, pears and most fruits with pits add interesting texture to recipes and contain healthy nutrients and fiber.  Remember that citrus fruits and juices, such as grapefruit, can interact with certain medications, so check with your doctor or pharmacist to see if they're OK for you.  If you choose canned fruit or juice, make sure no sugar is added. Dairy: 2 to 3 servings a day Milk, yogurt, cheese and other dairy products are major sources of calcium, vitamin D and protein. But the key is to make sure that you choose dairy products that  are low fat or fat-free because otherwise they can be a major source of fat - and most of it is saturated. Examples of one serving include 1 cup skim or 1 percent milk, 1 cup low fat yogurt, or 1 1/2 ounces part-skim cheese. Low-fat or fat-free frozen yogurt can help you boost the amount of dairy products you eat while offering a sweet treat. Add fruit for a healthy twist.  If you have trouble digesting dairy products, choose lactose-free products or consider taking an over-the-counter product that contains the enzyme lactase, which can reduce or prevent the symptoms of lactose intolerance.  Go easy on regular  and even fat-free cheeses because they are typically high in sodium. Lean meat, poultry and fish: 6 servings or fewer a day Meat can be a rich source of protein, B vitamins, iron and zinc. Choose lean varieties and aim for no more than 6 ounces a day. Cutting back on your meat portion will allow room for more vegetables. Trim away skin and fat from poultry and meat and then bake, broil, grill or roast instead of frying in fat.  Eat heart-healthy fish, such as salmon, herring and tuna. These types of fish are high in omega-3 fatty acids, which can help lower your total cholesterol. Nuts, seeds and legumes: 4 to 5 servings a week Almonds, sunflower seeds, kidney beans, peas, lentils and other foods in this family are good sources of magnesium, potassium and protein. They're also full of fiber and phytochemicals, which are plant compounds that may protect against some cancers and cardiovascular disease. Serving sizes are small and are intended to be consumed only a few times a week because these foods are high in calories. Examples of one serving include 1/3 cup nuts, 2 tablespoons seeds, or 1/2 cup cooked beans or peas.  Nuts sometimes get a bad rap because of their fat content, but they contain healthy types of fat - monounsaturated fat and omega-3 fatty acids. They're high in calories, however, so  eat them in moderation. Try adding them to stir-fries, salads or cereals.  Soybean-based products, such as tofu and tempeh, can be a good alternative to meat because they contain all of the amino acids your body needs to make a complete protein, just like meat. Fats and oils: 2 to 3 servings a day Fat helps your body absorb essential vitamins and helps your body's immune system. But too much fat increases your risk of heart disease, diabetes and obesity. The DASH diet strives for a healthy balance by limiting total fat to less than 30 percent of daily calories from fat, with a focus on the healthier monounsaturated fats. Examples of one serving include 1 teaspoon soft margarine, 1 tablespoon mayonnaise or 2 tablespoons salad dressing. Saturated fat and trans fat are the main dietary culprits in increasing your risk of coronary artery disease. DASH helps keep your daily saturated fat to less than 6 percent of your total calories by limiting use of meat, butter, cheese, whole milk, cream and eggs in your diet, along with foods made from lard, solid shortenings, and palm and coconut oils.  Avoid trans fat, commonly found in such processed foods as crackers, baked goods and fried items.  Read food labels on margarine and salad dressing so that you can choose those that are lowest in saturated fat and free of trans fat. Sweets: 5 servings or fewer a week You don't have to banish sweets entirely while following the DASH diet - just go easy on them. Examples of one serving include 1 tablespoon sugar, jelly or jam, 1/2 cup sorbet, or 1 cup lemonade. When you eat sweets, choose those that are fat-free or low-fat, such as sorbets, fruit ices, jelly beans, hard candy, graham crackers or low-fat cookies.  Artificial sweeteners such as aspartame (NutraSweet, Equal) and sucralose (Splenda) may help satisfy your sweet tooth while sparing the sugar. But remember that you still must use them sensibly. It's OK to swap a  diet cola for a regular cola, but not in place of a more nutritious beverage such as low-fat milk or even plain water.  Cut back on added sugar, which  has no nutritional value but can pack on calories. DASH diet: Alcohol and caffeine Drinking too much alcohol can increase blood pressure. The Dietary Guidelines for Americans recommends that men limit alcohol to no more than two drinks a day and women to one or less. The DASH diet doesn't address caffeine consumption. The influence of caffeine on blood pressure remains unclear. But caffeine can cause your blood pressure to rise at least temporarily. If you already have high blood pressure or if you think caffeine is affecting your blood pressure, talk to your doctor about your caffeine consumption. DASH diet and weight loss While the DASH diet is not a weight-loss program, you may indeed lose unwanted pounds because it can help guide you toward healthier food choices. The DASH diet generally includes about 2,000 calories a day. If you're trying to lose weight, you may need to eat fewer calories. You may also need to adjust your serving goals based on your individual circumstances - something your health care team can help you decide. Tips to cut back on sodium The foods at the core of the DASH diet are naturally low in sodium. So just by following the DASH diet, you're likely to reduce your sodium intake. You also reduce sodium further by: Using sodium-free spices or flavorings with your food instead of salt  Not adding salt when cooking rice, pasta or hot cereal  Rinsing canned foods to remove some of the sodium  Buying foods labeled "no salt added," "sodium-free," "low sodium" or "very low sodium" One teaspoon of table salt has 2,325 mg of sodium. When you read food labels, you may be surprised at just how much sodium some processed foods contain. Even low-fat soups, canned vegetables, ready-to-eat cereals and sliced [place] from the local deli - foods  you may have considered healthy - often have lots of sodium. You may notice a difference in taste when you choose low-sodium food and beverages. If things seem too bland, gradually introduce low-sodium foods and cut back on table salt until you reach your sodium goal. That'll give your palate time to adjust. Using salt-free seasoning blends or herbs and spices may also ease the transition. It can take several weeks for your taste buds to get used to less salty foods. Putting the pieces of the DASH diet together Try these strategies to get started on the DASH diet:  Change gradually. If you now eat only one or two servings of fruits or vegetables a day, try to add a serving at lunch and one at dinner. Rather than switching to all whole grains, start by making one or two of your grain servings whole grains. Increasing fruits, vegetables and whole grains gradually can also help prevent bloating or diarrhea that may occur if you aren't used to eating a diet with lots of fiber. You can also try over-the-counter products to help reduce gas from beans and vegetables.  Reward successes and forgive slip-ups. Reward yourself with a nonfood treat for your accomplishments - rent a movie, purchase a book or get together with a friend. Everyone slips, especially when learning something new. Remember that changing your lifestyle is a long-term process. Find out what triggered your setback and then just pick up where you left off with the DASH diet.  Add physical activity. To boost your blood pressure lowering efforts even more, consider increasing your physical activity in addition to following the DASH diet. Combining both the DASH diet and physical activity makes it more likely that you'll  reduce your blood pressure.  Get support if you need it. If you're having trouble sticking to your diet, talk to your doctor or dietitian about it. You might get some tips that will help you stick to the DASH diet. Remember, healthy  eating isn't an all-or-nothing proposition. What's most important is that, on average, you eat healthier foods with plenty of variety - both to keep your diet nutritious and to avoid boredom or extremes. And with the DASH diet, you can have both.   Hydrochlorothiazide, HCTZ; Telmisartan tablets What is this medicine? TELMISARTAN; HYDROCHLOROTHIAZIDE (tel mi SAR tan; hye droe klor oh THYE a zide) is a combination of a drug that relaxes blood vessels and a diuretic. It is used to treat high blood pressure. This medicine may be used for other purposes; ask your health care provider or pharmacist if you have questions. COMMON BRAND NAME(S): Micardis HCT What should I tell my health care provider before I take this medicine? They need to know if you have any of these conditions: -decreased urine -if you are on a special diet, like a low-salt diet -immune system problems, like lupus -kidney disease -liver disease -an unusual or allergic reaction to telmisartan, hydrochlorothiazide, sulfa drugs, other medicines, foods, dyes, or preservatives -pregnant or trying to get pregnant -breast-feeding How should I use this medicine? Take this medicine by mouth with a drink of water. Follow the directions on the prescription label. You can take it with or without food. If it upsets your stomach, take it with food. Take your medicine at regular intervals. Do not take it more often than directed. Do not stop taking except on your doctor's advice. Talk to your pediatrician regarding the use of this medicine in children. Special care may be needed. Overdosage: If you think you have taken too much of this medicine contact a poison control center or emergency room at once. NOTE: This medicine is only for you. Do not share this medicine with others. What if I miss a dose? If you miss a dose, take it as soon as you can. If it is almost time for your next dose, take only that dose. Do not take double or extra  doses. What may interact with this medicine? -barbiturates, like phenobarbital -blood pressure medicines -corticosteroids -diabetic medicines -digoxin -diuretics, especially triamterene, spironolactone or amiloride -lithium -NSAIDs, medicines for pain and inflammation, like ibuprofen or naproxen -potassium salts or potassium supplements -prescription pain medicines -skeletal muscle relaxants like tubocurarine -some cholesterol lowering medicines like cholestyramine or colestipol -warfarin This list may not describe all possible interactions. Give your health care provider a list of all the medicines, herbs, non-prescription drugs, or dietary supplements you use. Also tell them if you smoke, drink alcohol, or use illegal drugs. Some items may interact with your medicine. What should I watch for while using this medicine? Check your blood pressure regularly while you are taking this medicine. Ask your doctor or health care professional what your blood pressure should be and when you should contact him or her. When you check your blood pressure, write down the measurements to show your doctor or health care professional. If you are taking this medicine for a long time, you must visit your health care professional for regular checks on your progress. Make sure you schedule appointments on a regular basis. You must not get dehydrated. Ask your doctor or health care professional how much fluid you need to drink a day. Check with him or her if you get an  attack of severe diarrhea, nausea and vomiting, or if you sweat a lot. The loss of too much body fluid can make it dangerous for you to take this medicine. Women should inform their doctor if they wish to become pregnant or think they might be pregnant. There is a potential for serious side effects to an unborn child, particularly in the second or third trimester. Talk to your health care professional or pharmacist for more information. You may get  drowsy or dizzy. Do not drive, use machinery, or do anything that needs mental alertness until you know how this drug affects you. Do not stand or sit up quickly, especially if you are an older patient. This reduces the risk of dizzy or fainting spells. Alcohol can make you more drowsy and dizzy. Avoid alcoholic drinks. This medicine may affect your blood sugar level. If you have diabetes, check with your doctor or health care professional before changing the dose of your diabetic medicine. Avoid salt substitutes unless you are told otherwise by your doctor or health care professional. Do not treat yourself for coughs, colds, or pain while you are taking this medicine without asking your doctor or health care professional for advice. Some ingredients may increase your blood pressure. This medicine can make you more sensitive to the sun. Keep out of the sun. If you cannot avoid being in the sun, wear protective clothing and use sunscreen. Do not use sun lamps or tanning beds/booths. What side effects may I notice from receiving this medicine? Side effects that you should report to your doctor or health care professional as soon as possible: -allergic reactions like skin rash, itching or hives, swelling of the face, lips, or tongue -breathing problems -changes in vision -dark urine -eye pain -fast or irregular heart beat, palpitations, or chest pain -feeling faint or lightheaded -muscle cramps -persistent dry cough -redness, blistering, peeling or loosening of the skin, including inside the mouth -stomach pain -trouble passing urine or change in the amount of urine -unusual bleeding or bruising -worsened gout pain -yellowing of the eyes or skin Side effects that usually do not require medical attention (report to your doctor or health care professional if they continue or are bothersome): -change in sex drive or performance -headache This list may not describe all possible side effects. Call  your doctor for medical advice about side effects. You may report side effects to FDA at 1-800-FDA-1088. Where should I keep my medicine? Keep out of the reach of children. Store at room temperature between 15 and 30 degrees C (59 and 86 degrees F). Tablets should not be removed from the blisters until right before use. Throw away any unused medicine after the expiration date. NOTE: This sheet is a summary. It may not cover all possible information. If you have questions about this medicine, talk to your doctor, pharmacist, or health care provider.  2018 Elsevier/Gold Standard (2010-02-16 14:52:10)

## 2017-12-20 DIAGNOSIS — R1012 Left upper quadrant pain: Secondary | ICD-10-CM | POA: Diagnosis not present

## 2017-12-20 DIAGNOSIS — R1032 Left lower quadrant pain: Secondary | ICD-10-CM | POA: Diagnosis not present

## 2017-12-21 LAB — BASIC METABOLIC PANEL
BUN / CREAT RATIO: 9 (ref 9–23)
BUN: 7 mg/dL (ref 6–24)
CHLORIDE: 102 mmol/L (ref 96–106)
CO2: 21 mmol/L (ref 20–29)
Calcium: 9.3 mg/dL (ref 8.7–10.2)
Creatinine, Ser: 0.75 mg/dL (ref 0.57–1.00)
GFR calc non Af Amer: 98 mL/min/{1.73_m2} (ref >59)
GFR, EST AFRICAN AMERICAN: 113 mL/min/{1.73_m2} (ref >59)
Glucose: 178 mg/dL — ABNORMAL HIGH (ref 65–99)
POTASSIUM: 4.2 mmol/L (ref 3.5–5.2)
Sodium: 140 mmol/L (ref 134–144)

## 2017-12-21 LAB — ALDOSTERONE + RENIN ACTIVITY W/ RATIO
ALDOS/RENIN RATIO: 6.4 (ref 0.0–30.0)
ALDOSTERONE: 8.6 ng/dL (ref 0.0–30.0)
Renin: 1.334 ng/mL/hr (ref 0.167–5.380)

## 2018-01-08 ENCOUNTER — Ambulatory Visit (INDEPENDENT_AMBULATORY_CARE_PROVIDER_SITE_OTHER): Payer: BLUE CROSS/BLUE SHIELD

## 2018-01-08 DIAGNOSIS — I10 Essential (primary) hypertension: Secondary | ICD-10-CM | POA: Diagnosis not present

## 2018-01-08 NOTE — Progress Notes (Signed)
Renal artery duplex has been performed.  [place]

## 2018-01-09 ENCOUNTER — Other Ambulatory Visit: Payer: Self-pay | Admitting: Emergency Medicine

## 2018-01-09 ENCOUNTER — Telehealth: Payer: Self-pay | Admitting: Emergency Medicine

## 2018-01-09 ENCOUNTER — Encounter: Payer: Self-pay | Admitting: Cardiology

## 2018-01-09 ENCOUNTER — Other Ambulatory Visit: Payer: Self-pay | Admitting: *Deleted

## 2018-01-09 MED ORDER — AMLODIPINE BESYLATE 5 MG PO TABS: 5.0000 mg | ORAL_TABLET | Freq: Every day | ORAL | 3 refills | Status: DC

## 2018-01-09 MED ORDER — LOSARTAN POTASSIUM 50 MG PO TABS: 50.0000 mg | ORAL_TABLET | Freq: Every day | ORAL | 3 refills | Status: DC

## 2018-01-09 NOTE — Telephone Encounter (Signed)
Patient reports stopping her telmisartan-hydrochlorithiazide 40-12.5 mg tablets two days ago due to it causing her to vomit and a "pounding heart rate" in the 120s. Patient's blood pressure last night was 182/112 and heart rate 101. Dr. Bettina Gavia messaged about this. No response from Dr. Bettina Gavia this time. Dr. Geraldo Pitter (dod) advised to start patient on Losartan 50 mg daily and return in 1 week for a bp and pulse check along with bmp. Called patient back to notify of this, there was no answer. Left message explaining new medication and risks if she were to become pregnant while taking this medication. Will follow up tomorrow with patient.

## 2018-01-09 NOTE — Telephone Encounter (Signed)
Informed patient to disregard previous phone call and follow direction that was given via mychart.

## 2018-03-14 DIAGNOSIS — R51 Headache: Secondary | ICD-10-CM | POA: Diagnosis not present

## 2018-03-14 DIAGNOSIS — R350 Frequency of micturition: Secondary | ICD-10-CM | POA: Diagnosis not present

## 2018-03-14 DIAGNOSIS — E119 Type 2 diabetes mellitus without complications: Secondary | ICD-10-CM | POA: Diagnosis not present

## 2018-03-14 DIAGNOSIS — R1032 Left lower quadrant pain: Secondary | ICD-10-CM | POA: Diagnosis not present

## 2018-03-14 DIAGNOSIS — H538 Other visual disturbances: Secondary | ICD-10-CM | POA: Diagnosis not present

## 2018-03-14 DIAGNOSIS — Z87891 Personal history of nicotine dependence: Secondary | ICD-10-CM | POA: Diagnosis not present

## 2018-03-14 DIAGNOSIS — E1165 Type 2 diabetes mellitus with hyperglycemia: Secondary | ICD-10-CM | POA: Diagnosis not present

## 2018-03-14 DIAGNOSIS — I10 Essential (primary) hypertension: Secondary | ICD-10-CM | POA: Diagnosis not present

## 2018-03-14 DIAGNOSIS — K760 Fatty (change of) liver, not elsewhere classified: Secondary | ICD-10-CM | POA: Diagnosis not present

## 2018-03-14 DIAGNOSIS — R739 Hyperglycemia, unspecified: Secondary | ICD-10-CM | POA: Diagnosis not present

## 2018-03-14 DIAGNOSIS — M545 Low back pain: Secondary | ICD-10-CM | POA: Diagnosis not present

## 2018-03-14 DIAGNOSIS — R1110 Vomiting, unspecified: Secondary | ICD-10-CM | POA: Diagnosis not present

## 2018-03-14 DIAGNOSIS — E1169 Type 2 diabetes mellitus with other specified complication: Secondary | ICD-10-CM | POA: Diagnosis not present

## 2018-04-28 DIAGNOSIS — R05 Cough: Secondary | ICD-10-CM | POA: Diagnosis not present

## 2018-04-28 DIAGNOSIS — R5383 Other fatigue: Secondary | ICD-10-CM | POA: Diagnosis not present

## 2018-04-28 DIAGNOSIS — J209 Acute bronchitis, unspecified: Secondary | ICD-10-CM | POA: Diagnosis not present

## 2018-05-04 DIAGNOSIS — J209 Acute bronchitis, unspecified: Secondary | ICD-10-CM | POA: Diagnosis not present

## 2018-07-04 ENCOUNTER — Emergency Department (HOSPITAL_BASED_OUTPATIENT_CLINIC_OR_DEPARTMENT_OTHER)
Admission: EM | Admit: 2018-07-04 | Discharge: 2018-07-04 | Disposition: A | Discharge status: Home / Self Care | Payer: BLUE CROSS/BLUE SHIELD | Attending: Emergency Medicine | Admitting: Emergency Medicine

## 2018-07-04 ENCOUNTER — Encounter (HOSPITAL_BASED_OUTPATIENT_CLINIC_OR_DEPARTMENT_OTHER): Payer: Self-pay | Admitting: *Deleted

## 2018-07-04 ENCOUNTER — Other Ambulatory Visit: Payer: Self-pay

## 2018-07-04 DIAGNOSIS — Z87891 Personal history of nicotine dependence: Secondary | ICD-10-CM | POA: Insufficient documentation

## 2018-07-04 DIAGNOSIS — Z91040 Latex allergy status: Secondary | ICD-10-CM | POA: Diagnosis not present

## 2018-07-04 DIAGNOSIS — R5383 Other fatigue: Secondary | ICD-10-CM | POA: Diagnosis present

## 2018-07-04 DIAGNOSIS — E1165 Type 2 diabetes mellitus with hyperglycemia: Secondary | ICD-10-CM | POA: Diagnosis not present

## 2018-07-04 DIAGNOSIS — E119 Type 2 diabetes mellitus without complications: Secondary | ICD-10-CM

## 2018-07-04 DIAGNOSIS — I10 Essential (primary) hypertension: Secondary | ICD-10-CM | POA: Insufficient documentation

## 2018-07-04 DIAGNOSIS — Z79899 Other long term (current) drug therapy: Secondary | ICD-10-CM | POA: Diagnosis not present

## 2018-07-04 DIAGNOSIS — R739 Hyperglycemia, unspecified: Secondary | ICD-10-CM

## 2018-07-04 LAB — CBC
HCT: 40.4 % (ref 36.0–46.0)
Hemoglobin: 13.3 g/dL (ref 12.0–15.0)
MCH: 29.8 pg (ref 26.0–34.0)
MCHC: 32.9 g/dL (ref 30.0–36.0)
MCV: 90.6 fL (ref 80.0–100.0)
NRBC: 0 % (ref 0.0–0.2)
PLATELETS: 227 10*3/uL (ref 150–400)
RBC: 4.46 MIL/uL (ref 3.87–5.11)
RDW: 12.2 % (ref 11.5–15.5)
WBC: 9.3 10*3/uL (ref 4.0–10.5)

## 2018-07-04 LAB — BASIC METABOLIC PANEL
Anion gap: 8 (ref 5–15)
BUN: 9 mg/dL (ref 6–20)
CALCIUM: 9.1 mg/dL (ref 8.9–10.3)
CO2: 24 mmol/L (ref 22–32)
Chloride: 100 mmol/L (ref 98–111)
Creatinine, Ser: 0.68 mg/dL (ref 0.44–1.00)
GFR calc non Af Amer: >60 mL/min (ref >60)
Glucose, Bld: 338 mg/dL — ABNORMAL HIGH (ref 70–99)
Potassium: 3.6 mmol/L (ref 3.5–5.1)
SODIUM: 132 mmol/L — AB (ref 135–145)

## 2018-07-04 LAB — URINALYSIS, MICROSCOPIC (REFLEX): WBC, UA: NONE SEEN WBC/hpf (ref 0–5)

## 2018-07-04 LAB — URINALYSIS, ROUTINE W REFLEX MICROSCOPIC
Bilirubin Urine: NEGATIVE
Glucose, UA: >=500 mg/dL — AB
Hgb urine dipstick: NEGATIVE
Ketones, ur: NEGATIVE mg/dL
Leukocytes, UA: NEGATIVE
Nitrite: NEGATIVE
PROTEIN: NEGATIVE mg/dL
Specific Gravity, Urine: 1.01 (ref 1.005–1.030)
pH: 6 (ref 5.0–8.0)

## 2018-07-04 LAB — CBG MONITORING, ED: GLUCOSE-CAPILLARY: 332 mg/dL — AB (ref 70–99)

## 2018-07-04 MED ORDER — INSULIN GLARGINE 100 UNIT/ML ~~LOC~~ SOLN: 10.0000 [IU] | Freq: Every day | SUBCUTANEOUS | 0 refills | Status: DC

## 2018-07-04 MED ORDER — INSULIN GLARGINE 100 UNIT/ML ~~LOC~~ SOLN
10.0000 [IU] | Freq: Once | SUBCUTANEOUS | Status: AC
  Administered 2018-07-04: 10 [IU] via SUBCUTANEOUS
  Filled 2018-07-04: qty 10

## 2018-07-04 MED ORDER — "INSULIN SYRINGE 27G X 1/2"" 0.5 ML MISC": 1.0000 | Freq: Once | 1 refills | Status: AC

## 2018-07-04 NOTE — ED Notes (Signed)
PT states understanding of care given, follow up care, and medication prescribed. PT ambulated from ED to car with a steady gait. 

## 2018-07-04 NOTE — ED Triage Notes (Signed)
Fatigue this after noon, nausea and headache. Her CBG at home was 510. She stopped taking her oral diabetic medication a month ago.

## 2018-07-04 NOTE — ED Provider Notes (Signed)
[facility] EMERGENCY DEPARTMENT Provider Note   CSN: [id number] Arrival date & time: 07/04/18  1925     History   Chief Complaint Chief Complaint  Patient presents with  . Hyperglycemia    HPI Deborah Sheppard is a 43 y.o. female.  HPI Patient reports that she is not taking anything for her diabetes.  She reports that she is between doctors.  She reports she had been on multiple different oral agents and not tolerated any of them.  She reports that they all give her severe nausea and vomiting.  Patient reports lately she is felt more fatigued had increased urinary frequency and slight blurring of the vision.  She reports she checked her sugar at home and it was in the 500s.  Does not have fever or chills.  No pain. Past Medical History:  Diagnosis Date  . Essential hypertension 03/15/2016  . Fatigue 11/28/2017  . Fatty liver   . Gastroesophageal reflux disease without esophagitis 03/15/2016  . GERD (gastroesophageal reflux disease)   . 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 Obesity (BMI 30-39.9) 03/15/2016  . Peripheral edema   . Snoring 03/15/2016  . Type 2 diabetes mellitus without complication, without long-term current use of insulin ([place]) 03/15/2016  . Vitamin D deficiency 11/28/2017    Patient Active Problem List   Diagnosis Date Noted  . History of chest pain 12/18/2017  . Fatigue 11/28/2017  . Vitamin D deficiency 11/28/2017  . Essential hypertension 03/15/2016  . Gastroesophageal reflux disease without esophagitis 03/15/2016  . BMI 39.0-39.9,adult 03/15/2016  . Snoring 03/15/2016  . Type 2 diabetes mellitus without complication, without long-term current use of insulin ([place]) 03/15/2016  . 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 ABDOMINAL HYSTERECTOMY    . CERVICAL SPINE SURGERY    . CHOLECYSTECTOMY    . LIPOMA EXCISION    . MYRINGOTOMY WITH TUBE PLACEMENT    . TONSILLECTOMY AND ADENOIDECTOMY       OB History     No obstetric history on file.      Home Medications    Prior to Admission medications   Medication Sig Start Date End Date Taking? Authorizing Provider  lisinopril (PRINIVIL,ZESTRIL) 30 MG tablet Take 30 mg by mouth daily.   Yes [provider]  omeprazole (PRILOSEC) 20 MG capsule Take 20 mg by mouth daily.   Yes [provider]  amLODipine (NORVASC) 5 MG tablet Take 1 tablet (5 mg total) by mouth daily. 01/09/18 04/09/18  Richardo Priest, MD  butalbital-acetaminophen-caffeine (FIORICET/CODEINE) [phone] MG capsule Take 1 capsule by mouth every 4 (four) hours as needed for headache.    [provider]  EPINEPHrine (EPIPEN 2-PAK) 0.3 mg/0.3 mL IJ SOAJ injection  02/01/16   [provider]  insulin glargine (LANTUS) 100 UNIT/ML injection Inject 0.1 mLs (10 Units total) into the skin at bedtime. 07/04/18   Charlesetta Shanks, MD  Insulin Syringe 27G X 1/2" 0.5 ML MISC 1 Syringe by Does not apply route once for 1 dose. 07/04/18 07/04/18  Charlesetta Shanks, MD  ondansetron (ZOFRAN) 8 MG tablet Take 8 mg by mouth as needed.    [provider]  Vitamin D, Ergocalciferol, (DRISDOL) 50000 units CAPS capsule Take 50,000 Units by mouth every 7 (seven) days.    [provider]    Family History Family History  Problem Relation Age of Onset  . Hyperlipidemia Mother   . Hypertension Mother   .  Diabetes Mother   . Migraines Sister   . Heart attack Paternal Aunt     Social History Social History   Tobacco Use  . Smoking status: Former Smoker    Packs/day: 1.00    Years: 8.00    Pack years: 8.00    Last attempt to quit: 01/2011    Years since quitting: 7.4  . Smokeless tobacco: Never Used  Substance Use Topics  . Alcohol use: Yes    Comment: rarely  . Drug use: Never     Allergies   Sumatriptan; Clarithromycin; Latex; Ceftriaxone; Metformin and related; Sumatriptan succinate; and Gabapentin   Review of Systems Review of Systems 10  Systems reviewed and are negative for acute change except as noted in the HPI.  Physical Exam Updated Vital Signs BP 136/79 (BP Location: Left Arm)   Pulse 83   Temp 98.1 F (36.7 C) (Oral)   Resp 18   Ht 5\' 6"  (1.676 m)   Wt 104.3 kg   SpO2 97%   BMI 37.12 kg/m   Physical Exam Constitutional:      Appearance: She is well-developed.  HENT:     Head: Normocephalic and atraumatic.  Eyes:     Pupils: Pupils are equal, round, and reactive to light.  Neck:     Musculoskeletal: Neck supple.  Cardiovascular:     Rate and Rhythm: Normal rate and regular rhythm.     Heart sounds: Normal heart sounds.  Pulmonary:     Effort: Pulmonary effort is normal.     Breath sounds: Normal breath sounds.  Abdominal:     General: Bowel sounds are normal. There is no distension.     Palpations: Abdomen is soft.     Tenderness: There is no abdominal tenderness.  Musculoskeletal: Normal range of motion.  Skin:    General: Skin is warm and dry.  Neurological:     Mental Status: She is alert and oriented to person, place, and time.     GCS: GCS eye subscore is 4. GCS verbal subscore is 5. GCS motor subscore is 6.     Coordination: Coordination normal.      ED Treatments / Results  Labs (all labs ordered are listed, but only abnormal results are displayed) Labs Reviewed  BASIC METABOLIC PANEL - Abnormal; Notable for the following components:      Result Value   Sodium 132 (*)    Glucose, Bld 338 (*)    All other components within normal limits  URINALYSIS, ROUTINE W REFLEX MICROSCOPIC - Abnormal; Notable for the following components:   Glucose, UA >=500 (*)    All other components within normal limits  URINALYSIS, MICROSCOPIC (REFLEX) - Abnormal; Notable for the following components:   Bacteria, UA RARE (*)    All other components within normal limits  CBG MONITORING, ED - Abnormal; Notable for the following components:   Glucose-Capillary 332 (*)    All other components within  normal limits  CBC    EKG None  Radiology No results found.  Procedures Procedures (including critical care time)  Medications Ordered in ED Medications  insulin glargine (LANTUS) injection 10 Units (10 Units Subcutaneous Given 07/04/18 2209)     Initial Impression / Assessment and Plan / ED Course  I have reviewed the triage vital signs and the nursing notes.  Pertinent labs & imaging results that were available during my care of the patient were reviewed by me and considered in my medical decision making (see chart for  details).    Patient is alert and well in appearance.  She is in no distress.  Glucose is elevated in the 300s.  She however shows no signs of hyperosmolar nonketotic condition.  Patient advises that she cannot tolerate any of the oral agents.  She advises that they have all given her severe nausea and vomiting.  She reports that that precludes her from going to work which she has to do.  Patient has had regular insulin before during hospitalization.  She reports that she feels confident she can administer her own insulin.  Patient reports she cannot be seen by a doctor in the next few days because she is having to get established with a new provider.  Patient is given a prescription for Lantus.  We will start her on 10 units in the evening.  Patient has glucometer and strips at home for monitoring.  She will monitor twice daily and keep a log.  She was taught by nursing as how to administer her Lantus.  She is strongly encouraged to get follow-up ASAP and to return immediately should she have any problems.  She is also counseled on signs and symptoms of hypoglycemia which she reports awareness.  Final Clinical Impressions(s) / ED Diagnoses   Final diagnoses:  Hyperglycemia  Type 2 diabetes mellitus without complication, without long-term current use of insulin [facility])    ED Discharge Orders         Ordered    insulin glargine (LANTUS) 100 UNIT/ML injection  Daily at  bedtime,   Status:  Discontinued     07/04/18 2233    insulin glargine (LANTUS) 100 UNIT/ML injection  Daily at bedtime     07/04/18 2244    Insulin Syringe 27G X 1/2" 0.5 ML MISC   Once     07/04/18 2244           Charlesetta Shanks, MD 07/05/18 0030

## 2018-07-04 NOTE — Discharge Instructions (Signed)
1.  You must get an appointment with a doctor for follow-up.  Use the referral number to call tomorrow.  You also can go to the [facility] and wellness center listed above.  Go there and fill out new patient paperwork. 2.  Make sure you are absolutely clear on how to use your insulin syringes.  Your insulin dose will be 10 units of Lantus night before bedtime.  Check your blood sugar on your monitor twice a day and write down the results.  If your blood sugars are less than 80 do not take a dose of Lantus. 3.  Starting dose will be 0.1 mL nightly.  Patient to examined the syringes with the pharmacist and make sure you understand the dosing.

## 2018-07-15 DIAGNOSIS — E559 Vitamin D deficiency, unspecified: Secondary | ICD-10-CM | POA: Diagnosis not present

## 2018-07-15 DIAGNOSIS — E119 Type 2 diabetes mellitus without complications: Secondary | ICD-10-CM | POA: Diagnosis not present

## 2018-07-15 DIAGNOSIS — E1169 Type 2 diabetes mellitus with other specified complication: Secondary | ICD-10-CM | POA: Diagnosis not present

## 2018-07-15 DIAGNOSIS — I10 Essential (primary) hypertension: Secondary | ICD-10-CM | POA: Diagnosis not present

## 2018-08-06 DIAGNOSIS — E119 Type 2 diabetes mellitus without complications: Secondary | ICD-10-CM | POA: Diagnosis not present

## 2018-08-10 DIAGNOSIS — S299XXA Unspecified injury of thorax, initial encounter: Secondary | ICD-10-CM | POA: Diagnosis not present

## 2018-08-10 DIAGNOSIS — Z9049 Acquired absence of other specified parts of digestive tract: Secondary | ICD-10-CM | POA: Diagnosis not present

## 2018-08-10 DIAGNOSIS — R51 Headache: Secondary | ICD-10-CM | POA: Diagnosis not present

## 2018-08-10 DIAGNOSIS — M549 Dorsalgia, unspecified: Secondary | ICD-10-CM | POA: Diagnosis not present

## 2018-08-10 DIAGNOSIS — Z981 Arthrodesis status: Secondary | ICD-10-CM | POA: Diagnosis not present

## 2018-08-10 DIAGNOSIS — E119 Type 2 diabetes mellitus without complications: Secondary | ICD-10-CM | POA: Diagnosis not present

## 2018-08-10 DIAGNOSIS — S39012A Strain of muscle, fascia and tendon of lower back, initial encounter: Secondary | ICD-10-CM | POA: Diagnosis not present

## 2018-08-10 DIAGNOSIS — M542 Cervicalgia: Secondary | ICD-10-CM | POA: Diagnosis not present

## 2018-08-10 DIAGNOSIS — S3992XA Unspecified injury of lower back, initial encounter: Secondary | ICD-10-CM | POA: Diagnosis not present

## 2018-08-10 DIAGNOSIS — S199XXA Unspecified injury of neck, initial encounter: Secondary | ICD-10-CM | POA: Diagnosis not present

## 2018-08-10 DIAGNOSIS — S161XXA Strain of muscle, fascia and tendon at neck level, initial encounter: Secondary | ICD-10-CM | POA: Diagnosis not present

## 2018-08-10 DIAGNOSIS — M25511 Pain in right shoulder: Secondary | ICD-10-CM | POA: Diagnosis not present

## 2018-08-10 DIAGNOSIS — Z87891 Personal history of nicotine dependence: Secondary | ICD-10-CM | POA: Diagnosis not present

## 2018-08-10 DIAGNOSIS — I10 Essential (primary) hypertension: Secondary | ICD-10-CM | POA: Diagnosis not present

## 2018-08-20 DIAGNOSIS — Z79899 Other long term (current) drug therapy: Secondary | ICD-10-CM | POA: Diagnosis not present

## 2018-09-17 DIAGNOSIS — E1140 Type 2 diabetes mellitus with diabetic neuropathy, unspecified: Secondary | ICD-10-CM | POA: Diagnosis not present

## 2018-09-17 DIAGNOSIS — R110 Nausea: Secondary | ICD-10-CM | POA: Diagnosis not present

## 2018-10-17 DIAGNOSIS — E1140 Type 2 diabetes mellitus with diabetic neuropathy, unspecified: Secondary | ICD-10-CM | POA: Diagnosis not present

## 2018-10-17 DIAGNOSIS — E559 Vitamin D deficiency, unspecified: Secondary | ICD-10-CM | POA: Diagnosis not present

## 2018-10-17 DIAGNOSIS — Z1331 Encounter for screening for depression: Secondary | ICD-10-CM | POA: Diagnosis not present

## 2018-10-24 DIAGNOSIS — E559 Vitamin D deficiency, unspecified: Secondary | ICD-10-CM | POA: Diagnosis not present

## 2018-10-24 DIAGNOSIS — E1140 Type 2 diabetes mellitus with diabetic neuropathy, unspecified: Secondary | ICD-10-CM | POA: Diagnosis not present

## 2018-11-06 DIAGNOSIS — R600 Localized edema: Secondary | ICD-10-CM | POA: Diagnosis not present

## 2018-11-06 DIAGNOSIS — E1140 Type 2 diabetes mellitus with diabetic neuropathy, unspecified: Secondary | ICD-10-CM | POA: Diagnosis not present

## 2018-11-13 DIAGNOSIS — R600 Localized edema: Secondary | ICD-10-CM | POA: Diagnosis not present

## 2018-11-28 DIAGNOSIS — N390 Urinary tract infection, site not specified: Secondary | ICD-10-CM | POA: Diagnosis not present

## 2018-12-02 DIAGNOSIS — Z1231 Encounter for screening mammogram for malignant neoplasm of breast: Secondary | ICD-10-CM | POA: Diagnosis not present

## 2019-02-06 DIAGNOSIS — M5431 Sciatica, right side: Secondary | ICD-10-CM | POA: Diagnosis not present

## 2019-02-06 DIAGNOSIS — M5432 Sciatica, left side: Secondary | ICD-10-CM | POA: Diagnosis not present

## 2019-02-06 DIAGNOSIS — M5116 Intervertebral disc disorders with radiculopathy, lumbar region: Secondary | ICD-10-CM | POA: Diagnosis not present

## 2019-02-06 DIAGNOSIS — M4726 Other spondylosis with radiculopathy, lumbar region: Secondary | ICD-10-CM | POA: Diagnosis not present

## 2019-02-14 DIAGNOSIS — M545 Low back pain: Secondary | ICD-10-CM | POA: Diagnosis not present

## 2019-02-14 DIAGNOSIS — M4726 Other spondylosis with radiculopathy, lumbar region: Secondary | ICD-10-CM | POA: Diagnosis not present

## 2019-02-27 DIAGNOSIS — M5116 Intervertebral disc disorders with radiculopathy, lumbar region: Secondary | ICD-10-CM | POA: Diagnosis not present

## 2019-02-27 DIAGNOSIS — M47816 Spondylosis without myelopathy or radiculopathy, lumbar region: Secondary | ICD-10-CM | POA: Diagnosis not present

## 2019-02-27 DIAGNOSIS — M549 Dorsalgia, unspecified: Secondary | ICD-10-CM | POA: Diagnosis not present

## 2019-03-06 DIAGNOSIS — M5136 Other intervertebral disc degeneration, lumbar region: Secondary | ICD-10-CM | POA: Diagnosis not present

## 2019-03-06 DIAGNOSIS — Z6837 Body mass index (BMI) 37.0-37.9, adult: Secondary | ICD-10-CM | POA: Diagnosis not present

## 2019-03-06 DIAGNOSIS — M549 Dorsalgia, unspecified: Secondary | ICD-10-CM | POA: Diagnosis not present

## 2019-03-06 DIAGNOSIS — M47816 Spondylosis without myelopathy or radiculopathy, lumbar region: Secondary | ICD-10-CM | POA: Diagnosis not present

## 2019-03-20 DIAGNOSIS — E782 Mixed hyperlipidemia: Secondary | ICD-10-CM | POA: Diagnosis not present

## 2019-03-20 DIAGNOSIS — E559 Vitamin D deficiency, unspecified: Secondary | ICD-10-CM | POA: Diagnosis not present

## 2019-03-20 DIAGNOSIS — I10 Essential (primary) hypertension: Secondary | ICD-10-CM | POA: Diagnosis not present

## 2019-03-20 DIAGNOSIS — E1169 Type 2 diabetes mellitus with other specified complication: Secondary | ICD-10-CM | POA: Diagnosis not present

## 2019-03-20 DIAGNOSIS — E119 Type 2 diabetes mellitus without complications: Secondary | ICD-10-CM | POA: Diagnosis not present

## 2019-03-20 DIAGNOSIS — Z79899 Other long term (current) drug therapy: Secondary | ICD-10-CM | POA: Diagnosis not present

## 2019-03-26 DIAGNOSIS — M6281 Muscle weakness (generalized): Secondary | ICD-10-CM | POA: Diagnosis not present

## 2019-03-26 DIAGNOSIS — M2560 Stiffness of unspecified joint, not elsewhere classified: Secondary | ICD-10-CM | POA: Diagnosis not present

## 2019-03-26 DIAGNOSIS — M4727 Other spondylosis with radiculopathy, lumbosacral region: Secondary | ICD-10-CM | POA: Diagnosis not present

## 2019-04-03 DIAGNOSIS — M4727 Other spondylosis with radiculopathy, lumbosacral region: Secondary | ICD-10-CM | POA: Diagnosis not present

## 2019-04-03 DIAGNOSIS — M2560 Stiffness of unspecified joint, not elsewhere classified: Secondary | ICD-10-CM | POA: Diagnosis not present

## 2019-04-03 DIAGNOSIS — M6281 Muscle weakness (generalized): Secondary | ICD-10-CM | POA: Diagnosis not present

## 2019-04-07 DIAGNOSIS — M6281 Muscle weakness (generalized): Secondary | ICD-10-CM | POA: Diagnosis not present

## 2019-04-07 DIAGNOSIS — M2560 Stiffness of unspecified joint, not elsewhere classified: Secondary | ICD-10-CM | POA: Diagnosis not present

## 2019-04-07 DIAGNOSIS — M4727 Other spondylosis with radiculopathy, lumbosacral region: Secondary | ICD-10-CM | POA: Diagnosis not present

## 2019-04-14 DIAGNOSIS — M4727 Other spondylosis with radiculopathy, lumbosacral region: Secondary | ICD-10-CM | POA: Diagnosis not present

## 2019-04-14 DIAGNOSIS — M2560 Stiffness of unspecified joint, not elsewhere classified: Secondary | ICD-10-CM | POA: Diagnosis not present

## 2019-04-14 DIAGNOSIS — M6281 Muscle weakness (generalized): Secondary | ICD-10-CM | POA: Diagnosis not present

## 2019-04-16 DIAGNOSIS — M2560 Stiffness of unspecified joint, not elsewhere classified: Secondary | ICD-10-CM | POA: Diagnosis not present

## 2019-04-16 DIAGNOSIS — M4727 Other spondylosis with radiculopathy, lumbosacral region: Secondary | ICD-10-CM | POA: Diagnosis not present

## 2019-04-16 DIAGNOSIS — M6281 Muscle weakness (generalized): Secondary | ICD-10-CM | POA: Diagnosis not present

## 2019-04-21 DIAGNOSIS — M4727 Other spondylosis with radiculopathy, lumbosacral region: Secondary | ICD-10-CM | POA: Diagnosis not present

## 2019-04-21 DIAGNOSIS — M6281 Muscle weakness (generalized): Secondary | ICD-10-CM | POA: Diagnosis not present

## 2019-04-21 DIAGNOSIS — M2560 Stiffness of unspecified joint, not elsewhere classified: Secondary | ICD-10-CM | POA: Diagnosis not present

## 2019-04-23 DIAGNOSIS — M6281 Muscle weakness (generalized): Secondary | ICD-10-CM | POA: Diagnosis not present

## 2019-04-23 DIAGNOSIS — M4727 Other spondylosis with radiculopathy, lumbosacral region: Secondary | ICD-10-CM | POA: Diagnosis not present

## 2019-04-23 DIAGNOSIS — M2560 Stiffness of unspecified joint, not elsewhere classified: Secondary | ICD-10-CM | POA: Diagnosis not present

## 2019-04-30 DIAGNOSIS — M2560 Stiffness of unspecified joint, not elsewhere classified: Secondary | ICD-10-CM | POA: Diagnosis not present

## 2019-04-30 DIAGNOSIS — M4727 Other spondylosis with radiculopathy, lumbosacral region: Secondary | ICD-10-CM | POA: Diagnosis not present

## 2019-04-30 DIAGNOSIS — M6281 Muscle weakness (generalized): Secondary | ICD-10-CM | POA: Diagnosis not present

## 2019-05-07 DIAGNOSIS — M6281 Muscle weakness (generalized): Secondary | ICD-10-CM | POA: Diagnosis not present

## 2019-05-07 DIAGNOSIS — M2560 Stiffness of unspecified joint, not elsewhere classified: Secondary | ICD-10-CM | POA: Diagnosis not present

## 2019-05-07 DIAGNOSIS — M4727 Other spondylosis with radiculopathy, lumbosacral region: Secondary | ICD-10-CM | POA: Diagnosis not present

## 2019-05-21 DIAGNOSIS — M2560 Stiffness of unspecified joint, not elsewhere classified: Secondary | ICD-10-CM | POA: Diagnosis not present

## 2019-05-21 DIAGNOSIS — M6281 Muscle weakness (generalized): Secondary | ICD-10-CM | POA: Diagnosis not present

## 2019-05-21 DIAGNOSIS — M4727 Other spondylosis with radiculopathy, lumbosacral region: Secondary | ICD-10-CM | POA: Diagnosis not present

## 2019-05-23 DIAGNOSIS — M4727 Other spondylosis with radiculopathy, lumbosacral region: Secondary | ICD-10-CM | POA: Diagnosis not present

## 2019-05-23 DIAGNOSIS — M2560 Stiffness of unspecified joint, not elsewhere classified: Secondary | ICD-10-CM | POA: Diagnosis not present

## 2019-05-23 DIAGNOSIS — M6281 Muscle weakness (generalized): Secondary | ICD-10-CM | POA: Diagnosis not present

## 2019-05-26 DIAGNOSIS — M4727 Other spondylosis with radiculopathy, lumbosacral region: Secondary | ICD-10-CM | POA: Diagnosis not present

## 2019-05-26 DIAGNOSIS — M6281 Muscle weakness (generalized): Secondary | ICD-10-CM | POA: Diagnosis not present

## 2019-05-28 DIAGNOSIS — M6281 Muscle weakness (generalized): Secondary | ICD-10-CM | POA: Diagnosis not present

## 2019-05-28 DIAGNOSIS — M2560 Stiffness of unspecified joint, not elsewhere classified: Secondary | ICD-10-CM | POA: Diagnosis not present

## 2019-05-28 DIAGNOSIS — M4727 Other spondylosis with radiculopathy, lumbosacral region: Secondary | ICD-10-CM | POA: Diagnosis not present

## 2019-06-24 DIAGNOSIS — R6889 Other general symptoms and signs: Secondary | ICD-10-CM | POA: Diagnosis not present

## 2019-06-24 DIAGNOSIS — R05 Cough: Secondary | ICD-10-CM | POA: Diagnosis not present

## 2019-06-26 DIAGNOSIS — Z20822 Contact with and (suspected) exposure to covid-19: Secondary | ICD-10-CM | POA: Diagnosis not present

## 2019-06-26 DIAGNOSIS — R6889 Other general symptoms and signs: Secondary | ICD-10-CM | POA: Diagnosis not present

## 2019-08-02 NOTE — Progress Notes (Deleted)
Cardiology Office Note:    Date:  08/02/2019   ID:  Deborah Sheppard, DOB 09-30-1975, MRN [id number]  PCP:  Sarajane Jews, FNP  Cardiologist:  Shirlee More, MD    Referring MD: Sarajane Jews, FNP    ASSESSMENT:    No diagnosis found. PLAN:    In order of problems listed above:  1. ***   Next appointment: ***   Medication Adjustments/Labs and Tests Ordered: Current medicines are reviewed at length with the patient today.  Concerns regarding medicines are outlined above.  No orders of the defined types were placed in this encounter.  No orders of the defined types were placed in this encounter.   No chief complaint on file.   History of Present Illness:    Deborah Sheppard is a 44 y.o. female with a hx of hypertension and T2 DM  last seen 12/18/2017. Compliance with diet, lifestyle and medications: *** Past Medical History:  Diagnosis Date  . Essential hypertension 03/15/2016  . Fatigue 11/28/2017  . Fatty liver   . Gastroesophageal reflux disease without esophagitis 03/15/2016  . GERD (gastroesophageal reflux disease)   . 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 Obesity (BMI 30-39.9) 03/15/2016  . Peripheral edema   . Snoring 03/15/2016  . Type 2 diabetes mellitus without complication, without long-term current use of insulin ([place]) 03/15/2016  . Vitamin D deficiency 11/28/2017    Past Surgical History:  Procedure Laterality Date  . ABDOMINAL HYSTERECTOMY    . CERVICAL SPINE SURGERY    . CHOLECYSTECTOMY    . LIPOMA EXCISION    . MYRINGOTOMY WITH TUBE PLACEMENT    . TONSILLECTOMY AND ADENOIDECTOMY      Current Medications: No outpatient medications have been marked as taking for the 08/04/19 encounter (Appointment) with Richardo Priest, MD.     Allergies:   Sumatriptan, Clarithromycin, Latex, Ceftriaxone, Metformin and related, Sumatriptan succinate, and Gabapentin   Social History   Socioeconomic History  . Marital status: Divorced    Spouse name: Not on file  .  Number of children: Not on file  . Years of education: Not on file  . Highest education level: Not on file  Occupational History  . Not on file  Tobacco Use  . Smoking status: Former Smoker    Packs/day: 1.00    Years: 8.00    Pack years: 8.00    Quit date: 01/2011    Years since quitting: 8.5  . Smokeless tobacco: Never Used  Substance and Sexual Activity  . Alcohol use: Yes    Comment: rarely  . Drug use: Never  . Sexual activity: Not on file  Other Topics Concern  . Not on file  Social History Narrative  . Not on file   Social Determinants of Health   Financial Resource Strain:   . Difficulty of Paying Living Expenses: Not on file  Food Insecurity:   . Worried About Charity fundraiser in the Last Year: Not on file  . Ran Out of Food in the Last Year: Not on file  Transportation Needs:   . Lack of Transportation (Medical): Not on file  . Lack of Transportation (Non-Medical): Not on file  Physical Activity:   . Days of Exercise per Week: Not on file  . Minutes of Exercise per Session: Not on file  Stress:   . Feeling of Stress : Not on file  Social Connections:   . Frequency of Communication with Friends and Family: Not on file  .  Frequency of Social Gatherings with Friends and Family: Not on file  . Attends Religious Services: Not on file  . Active Member of Clubs or Organizations: Not on file  . Attends Archivist Meetings: Not on file  . Marital Status: Not on file     Family History: The patient's ***family history includes Diabetes in her mother; Heart attack in her paternal aunt; Hyperlipidemia in her mother; Hypertension in her mother; Migraines in her sister. ROS:   Please see the history of present illness.    All other systems reviewed and are negative.  EKGs/Labs/Other Studies Reviewed:    The following studies were reviewed today:  EKG:  EKG ordered today and personally reviewed.  The ekg ordered today demonstrates ***  Recent  Labs: No results found for requested labs within last 8760 hours.  Recent Lipid Panel No results found for: CHOL, TRIG, HDL, CHOLHDL, VLDL, LDLCALC, LDLDIRECT  Physical Exam:    VS:  There were no vitals taken for this visit.    Wt Readings from Last 3 Encounters:  07/04/18 230 lb (104.3 kg)  12/18/17 247 lb 3.2 oz (112.1 kg)  10/20/12 213 lb (96.6 kg)     GEN: *** Well nourished, well developed in no acute distress HEENT: Normal NECK: No JVD; No carotid bruits LYMPHATICS: No lymphadenopathy CARDIAC: ***RRR, no murmurs, rubs, gallops RESPIRATORY:  Clear to auscultation without rales, wheezing or rhonchi  ABDOMEN: Soft, non-tender, non-distended MUSCULOSKELETAL:  No edema; No deformity  SKIN: Warm and dry NEUROLOGIC:  Alert and oriented x 3 PSYCHIATRIC:  Normal affect    Signed, Shirlee More, MD  08/02/2019 4:57 PM    [facility]

## 2019-08-04 ENCOUNTER — Ambulatory Visit: Payer: BLUE CROSS/BLUE SHIELD | Admitting: Cardiology

## 2019-09-04 DIAGNOSIS — E1169 Type 2 diabetes mellitus with other specified complication: Secondary | ICD-10-CM | POA: Diagnosis not present

## 2019-09-04 DIAGNOSIS — G43909 Migraine, unspecified, not intractable, without status migrainosus: Secondary | ICD-10-CM | POA: Diagnosis not present

## 2019-09-04 DIAGNOSIS — E782 Mixed hyperlipidemia: Secondary | ICD-10-CM | POA: Diagnosis not present

## 2019-10-17 DIAGNOSIS — E119 Type 2 diabetes mellitus without complications: Secondary | ICD-10-CM | POA: Diagnosis not present

## 2019-10-17 DIAGNOSIS — E1169 Type 2 diabetes mellitus with other specified complication: Secondary | ICD-10-CM | POA: Diagnosis not present

## 2019-10-17 DIAGNOSIS — E782 Mixed hyperlipidemia: Secondary | ICD-10-CM | POA: Diagnosis not present

## 2019-10-17 DIAGNOSIS — I10 Essential (primary) hypertension: Secondary | ICD-10-CM | POA: Diagnosis not present

## 2019-10-17 DIAGNOSIS — Z8616 Personal history of COVID-19: Secondary | ICD-10-CM | POA: Diagnosis not present

## 2019-11-14 DIAGNOSIS — M545 Low back pain: Secondary | ICD-10-CM | POA: Diagnosis not present

## 2019-11-26 DIAGNOSIS — B353 Tinea pedis: Secondary | ICD-10-CM | POA: Diagnosis not present

## 2019-12-02 DIAGNOSIS — M542 Cervicalgia: Secondary | ICD-10-CM | POA: Diagnosis not present

## 2019-12-02 DIAGNOSIS — M5441 Lumbago with sciatica, right side: Secondary | ICD-10-CM | POA: Diagnosis not present

## 2019-12-02 DIAGNOSIS — Z5181 Encounter for therapeutic drug level monitoring: Secondary | ICD-10-CM | POA: Diagnosis not present

## 2019-12-02 DIAGNOSIS — G8929 Other chronic pain: Secondary | ICD-10-CM | POA: Diagnosis not present

## 2019-12-02 DIAGNOSIS — E0841 Diabetes mellitus due to underlying condition with diabetic mononeuropathy: Secondary | ICD-10-CM | POA: Diagnosis not present

## 2019-12-02 DIAGNOSIS — M533 Sacrococcygeal disorders, not elsewhere classified: Secondary | ICD-10-CM | POA: Diagnosis not present

## 2019-12-02 DIAGNOSIS — Z79899 Other long term (current) drug therapy: Secondary | ICD-10-CM | POA: Diagnosis not present

## 2019-12-02 DIAGNOSIS — M5442 Lumbago with sciatica, left side: Secondary | ICD-10-CM | POA: Diagnosis not present

## 2019-12-16 DIAGNOSIS — F419 Anxiety disorder, unspecified: Secondary | ICD-10-CM | POA: Diagnosis not present

## 2019-12-16 DIAGNOSIS — M2550 Pain in unspecified joint: Secondary | ICD-10-CM | POA: Diagnosis not present

## 2019-12-16 DIAGNOSIS — G43909 Migraine, unspecified, not intractable, without status migrainosus: Secondary | ICD-10-CM | POA: Diagnosis not present

## 2019-12-16 DIAGNOSIS — G8929 Other chronic pain: Secondary | ICD-10-CM | POA: Diagnosis not present

## 2019-12-16 DIAGNOSIS — M545 Low back pain: Secondary | ICD-10-CM | POA: Diagnosis not present

## 2019-12-24 NOTE — Progress Notes (Signed)
Cardiology Office Note:    Date:  12/25/2019   ID:  Deborah Sheppard, DOB 1975/12/27, MRN [id number]  PCP:  Earlyne Iba, NP  Cardiologist:  Shirlee More, MD    Referring MD: Sarajane Jews, FNP    ASSESSMENT:    1. Chest pain, precordial   2. Essential hypertension   3. Palpitations    PLAN:    In order of problems listed above:  1. Having chest pain best described as atypical angina unfortunately has contrast dye allergy undergo myocardial perfusion study in office she will start a low-dose beta-blocker 2. BP at target continue current treatment including ACE inhibitor 3. Live 14-day ZIO monitor   Next appointment: 6 weeks   Medication Adjustments/Labs and Tests Ordered: Current medicines are reviewed at length with the patient today.  Concerns regarding medicines are outlined above.  Orders Placed This Encounter  Procedures  . LONG TERM MONITOR-LIVE TELEMETRY (3-14 DAYS)  . MYOCARDIAL PERFUSION IMAGING  . MYOCARDIAL PERFUSION IMAGING  . EKG 12-Lead   Meds ordered this encounter  Medications  . metoprolol succinate (TOPROL XL) 25 MG 24 hr tablet    Sig: Take 1 tablet (25 mg total) by mouth daily.    Dispense:  90 tablet    Refill:  3    Chief Complaint  Patient presents with  . Follow-up  . Hypertension    History of Present Illness:    Deborah Sheppard is a 44 y.o. female with a hx of difficult to control hypertension last seen 12/18/2017. Compliance with diet, lifestyle and medications: Yes  Her evaluation sho showed  normal aldosterone reading ratio excluding hyperaldosteronism.  Renal vascular duplex showed mild increase in velocities in the renal arteries bilaterally however aortic velocity was also increased and there is no stenosis.  Her concern is episodes at night or wakes her up out of her sleep her heart is racing with associated chest tightness.  The chest tightness also occurs at other times unassociated with rapid heart rhythm.  It occurs with  and without activity last a few minutes but is relieved with rest and she describes it as a pressure aching in the left chest radiating out towards the axilla.  Episodes occur daily now.  She has no known history of heart disease or arrhythmia documented.  Will apply a live ZIO monitor today start on low-dose beta-blocker I had wanted to do a cardiac CTA for ischemia evaluation but she tells me she had urticaria with contrast dye and will do a myocardial perfusion study.  I will see her back in my office in 6 weeks.  Her hypertension is now well controlled. Past Medical History:  Diagnosis Date  . Essential hypertension 03/15/2016  . Fatigue 11/28/2017  . Fatty liver   . Gastroesophageal reflux disease without esophagitis 03/15/2016  . GERD (gastroesophageal reflux disease)   . 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 Obesity (BMI 30-39.9) 03/15/2016  . Peripheral edema   . Snoring 03/15/2016  . Type 2 diabetes mellitus without complication, without long-term current use of insulin ([place]) 03/15/2016  . Vitamin D deficiency 11/28/2017    Past Surgical History:  Procedure Laterality Date  . ABDOMINAL HYSTERECTOMY    . CERVICAL SPINE SURGERY    . CHOLECYSTECTOMY    . LIPOMA EXCISION    . MYRINGOTOMY WITH TUBE PLACEMENT    . TONSILLECTOMY AND ADENOIDECTOMY      Current Medications: Current Meds  Medication Sig  . ALPRAZolam (XANAX) 0.5 MG tablet Take  0.5 mg by mouth every 8 (eight) hours as needed.  . butalbital-acetaminophen-caffeine (FIORICET/CODEINE) 50-325-40-30 MG capsule Take 1 capsule by mouth every 4 (four) hours as needed for headache.  . celecoxib (CELEBREX) 200 MG capsule Take 200 mg by mouth daily.  . DULoxetine (CYMBALTA) 30 MG capsule Take 30 mg by mouth daily.  Marland Kitchen EMGALITY 120 MG/ML SOSY Apply topically.  Marland Kitchen EPINEPHrine (EPIPEN 2-PAK) 0.3 mg/0.3 mL IJ SOAJ injection   . ketorolac (TORADOL) 10 MG tablet Take 10 mg by mouth every 8 (eight) hours as needed.  Marland Kitchen lisinopril (PRINIVIL,ZESTRIL) 30  MG tablet Take 30 mg by mouth daily.  Marland Kitchen omeprazole (PRILOSEC) 20 MG capsule Take 20 mg by mouth 2 (two) times daily.  . ondansetron (ZOFRAN) 4 MG tablet Take 4 mg by mouth every 8 (eight) hours as needed.  . pregabalin (LYRICA) 75 MG capsule Take 75 mg by mouth 3 (three) times daily.  Marland Kitchen tiZANidine (ZANAFLEX) 2 MG tablet Take 2 mg by mouth every 12 (twelve) hours as needed.  . TRULICITY 1.5 CB/7.6EG SOPN Inject 0.5 mLs into the skin once a week.  . UBRELVY 100 MG TABS Take 1 tablet by mouth 2 (two) times daily as needed.  . Vitamin D, Ergocalciferol, (DRISDOL) 50000 units CAPS capsule Take 50,000 Units by mouth every 7 (seven) days.     Allergies:   Sumatriptan, Clarithromycin, Latex, Ceftriaxone, Metformin and related, Sumatriptan succinate, Gabapentin, and Iodinated diagnostic agents   Social History   Socioeconomic History  . Marital status: Divorced    Spouse name: Not on file  . Number of children: Not on file  . Years of education: Not on file  . Highest education level: Not on file  Occupational History  . Not on file  Tobacco Use  . Smoking status: Former Smoker    Packs/day: 1.00    Years: 8.00    Pack years: 8.00    Quit date: 01/2011    Years since quitting: 8.9  . Smokeless tobacco: Never Used  Vaping Use  . Vaping Use: Never used  Substance and Sexual Activity  . Alcohol use: Yes    Comment: rarely  . Drug use: Never  . Sexual activity: Not on file  Other Topics Concern  . Not on file  Social History Narrative  . Not on file   Social Determinants of Health   Financial Resource Strain:   . Difficulty of Paying Living Expenses:   Food Insecurity:   . Worried About Charity fundraiser in the Last Year:   . Arboriculturist in the Last Year:   Transportation Needs:   . Film/video editor (Medical):   Marland Kitchen Lack of Transportation (Non-Medical):   Physical Activity:   . Days of Exercise per Week:   . Minutes of Exercise per Session:   Stress:   . Feeling  of Stress :   Social Connections:   . Frequency of Communication with Friends and Family:   . Frequency of Social Gatherings with Friends and Family:   . Attends Religious Services:   . Active Member of Clubs or Organizations:   . Attends Archivist Meetings:   Marland Kitchen Marital Status:      Family History: The patient's family history includes Diabetes in her mother; Heart attack in her paternal aunt; Hyperlipidemia in her mother; Hypertension in her mother; Migraines in her sister. ROS:   Please see the history of present illness.    All other systems reviewed and  are negative.  EKGs/Labs/Other Studies Reviewed:    The following studies were reviewed today:  EKG:  EKG ordered today and personally reviewed.  The ekg ordered today demonstrates sinus rhythm normal  Recent Labs: No results found for requested labs within last 8760 hours.  Recent Lipid Panel No results found for: CHOL, TRIG, HDL, CHOLHDL, VLDL, LDLCALC, LDLDIRECT  Physical Exam:    VS:  BP 124/72   Pulse 89   Ht 5\' 6"  (1.676 m)   Wt 234 lb 3.2 oz (106.2 kg)   SpO2 96%   BMI 37.80 kg/m     Wt Readings from Last 3 Encounters:  12/25/19 234 lb 3.2 oz (106.2 kg)  07/04/18 230 lb (104.3 kg)  12/18/17 247 lb 3.2 oz (112.1 kg)     GEN:  Well nourished, well developed in no acute distress HEENT: Normal NECK: No JVD; No carotid bruits LYMPHATICS: No lymphadenopathy CARDIAC: RRR, no murmurs, rubs, gallops RESPIRATORY:  Clear to auscultation without rales, wheezing or rhonchi  ABDOMEN: Soft, non-tender, non-distended MUSCULOSKELETAL:  No edema; No deformity  SKIN: Warm and dry NEUROLOGIC:  Alert and oriented x 3 PSYCHIATRIC:  Normal affect    Signed, Shirlee More, MD  12/25/2019 11:10 AM    [place]

## 2019-12-25 ENCOUNTER — Ambulatory Visit (INDEPENDENT_AMBULATORY_CARE_PROVIDER_SITE_OTHER): Payer: BC Managed Care – PPO

## 2019-12-25 ENCOUNTER — Ambulatory Visit (INDEPENDENT_AMBULATORY_CARE_PROVIDER_SITE_OTHER): Payer: BC Managed Care – PPO | Admitting: Cardiology

## 2019-12-25 ENCOUNTER — Other Ambulatory Visit: Payer: Self-pay

## 2019-12-25 ENCOUNTER — Encounter: Payer: Self-pay | Admitting: Cardiology

## 2019-12-25 VITALS — BP 124/72 | HR 89 | Ht 66.0 in | Wt 234.2 lb

## 2019-12-25 DIAGNOSIS — I10 Essential (primary) hypertension: Secondary | ICD-10-CM | POA: Diagnosis not present

## 2019-12-25 DIAGNOSIS — R002 Palpitations: Secondary | ICD-10-CM

## 2019-12-25 DIAGNOSIS — R072 Precordial pain: Secondary | ICD-10-CM

## 2019-12-25 MED ORDER — METOPROLOL SUCCINATE ER 25 MG PO TB24: 25.0000 mg | ORAL_TABLET | Freq: Every day | ORAL | 3 refills | Status: AC

## 2019-12-25 NOTE — Patient Instructions (Signed)
Medication Instructions:  Your physician has recommended you make the following change in your medication:  START: TOPROL XL 25 mg  take one tablet by mouth daily.  *If you need a refill on your cardiac medications before your next appointment, please call your pharmacy*   Lab Work: None If you have labs (blood work) drawn today and your tests are completely normal, you will receive your results only by: Marland Kitchen MyChart Message (if you have MyChart) OR . A paper copy in the mail If you have any lab test that is abnormal or we need to change your treatment, we will call you to review the results.   Testing/Procedures: A zio monitor was ordered today. It will remain on for 14 days. You will then return monitor and event diary in provided box. It takes 1-2 weeks for report to be downloaded and returned to [place]. We will call you with the results. If monitor falls off or has orange flashing light, please call Zio for further instructions.     [facility] [address] [place], [place] [address] Phone:  [phone]    Please arrive 15 minutes prior to your appointment time for registration and insurance purposes.  The test will take approximately 3 to 4 hours to complete; you may bring reading material.  If someone comes with you to your appointment, they will need to remain in the main lobby due to limited space in the testing area. **If you are pregnant or breastfeeding, please notify the nuclear lab prior to your appointment**  How to prepare for your Myocardial Perfusion Test: . Do not eat or drink 3 hours prior to your test, except you may have water. . Do not consume products containing caffeine (regular or decaffeinated) 12 hours prior to your test. (ex: coffee, chocolate, sodas, tea). . Do bring a list of your current medications with you.  If not listed below, you may take your medications as normal. . Do wear comfortable clothes (no dresses or overalls) and  walking shoes, tennis shoes preferred (No heels or open toe shoes are allowed). . Do NOT wear cologne, perfume, aftershave, or lotions (deodorant is allowed). . If these instructions are not followed, your test will have to be rescheduled.  Please report to [address] for your test.  If you have questions or concerns about your appointment, you can call the [facility] at [phone].  If you cannot keep your appointment, please provide 24 hours notification to the Nuclear Lab, to avoid a possible $50 charge to your account.    Follow-Up: At [facility], you and your health needs are our priority.  As part of our continuing mission to provide you with exceptional heart care, we have created designated Provider Care Teams.  These Care Teams include your primary Cardiologist (physician) and Advanced Practice Providers (APPs -  Physician Assistants and Nurse Practitioners) who all work together to provide you with the care you need, when you need it.  We recommend signing up for the patient portal called "MyChart".  Sign up information is provided on this After Visit Summary.  MyChart is used to connect with patients for Virtual Visits (Telemedicine).  Patients are able to view lab/test results, encounter notes, upcoming appointments, etc.  Non-urgent messages can be sent to your provider as well.   To learn more about what you can do with MyChart, go to [email or web address].    Your next appointment:   6  week(s)  The format for your next appointment:   In Person  Provider:   Shirlee More, MD   Other Instructions

## 2019-12-26 DIAGNOSIS — R002 Palpitations: Secondary | ICD-10-CM | POA: Diagnosis not present

## 2019-12-31 ENCOUNTER — Telehealth: Payer: Self-pay | Admitting: *Deleted

## 2019-12-31 ENCOUNTER — Encounter: Payer: Self-pay | Admitting: *Deleted

## 2019-12-31 NOTE — Telephone Encounter (Signed)
Patient given detailed instructions per Myocardial Perfusion Study Information Sheet for the test on 01/07/2020 at 1100. Patient notified to arrive 15 minutes early and that it is imperative to arrive on time for appointment to keep from having the test rescheduled.  If you need to cancel or reschedule your appointment, please call the office within 24 hours of your appointment. . Patient verbalized understanding.[place] Mychart letter sent with instructions.

## 2020-01-03 DIAGNOSIS — G43109 Migraine with aura, not intractable, without status migrainosus: Secondary | ICD-10-CM | POA: Diagnosis not present

## 2020-01-07 ENCOUNTER — Other Ambulatory Visit: Payer: Self-pay

## 2020-01-07 ENCOUNTER — Ambulatory Visit (INDEPENDENT_AMBULATORY_CARE_PROVIDER_SITE_OTHER): Payer: BC Managed Care – PPO

## 2020-01-07 DIAGNOSIS — R072 Precordial pain: Secondary | ICD-10-CM | POA: Diagnosis not present

## 2020-01-07 MED ORDER — TECHNETIUM TC 99M TETROFOSMIN IV KIT
30.7000 | PACK | Freq: Once | INTRAVENOUS | Status: AC | PRN
  Administered 2020-01-07: 30.7 via INTRAVENOUS

## 2020-01-08 ENCOUNTER — Ambulatory Visit: Payer: BC Managed Care – PPO

## 2020-01-08 ENCOUNTER — Telehealth: Payer: Self-pay

## 2020-01-08 NOTE — Telephone Encounter (Signed)
Tried calling patient in regards to her missed stress test appointment. She did not answer but I left her a message asking that she please return out call.

## 2020-01-09 NOTE — Telephone Encounter (Signed)
Spoke with the patient just now and she let me know that she thought her daughter had called to cancel her second half of her stress test yesterday but she never did. She states that she would like to get it rescheduled at this time.

## 2020-01-15 ENCOUNTER — Encounter (HOSPITAL_COMMUNITY): Payer: Self-pay | Admitting: *Deleted

## 2020-01-21 ENCOUNTER — Telehealth (HOSPITAL_COMMUNITY): Payer: Self-pay | Admitting: *Deleted

## 2020-01-21 NOTE — Telephone Encounter (Signed)
Patient given detailed instructions per Myocardial Perfusion Study Information Sheet for the test on 01/28/20 at 11:00. Patient notified to arrive 15 minutes early and that it is imperative to arrive on time for appointment to keep from having the test rescheduled.  If you need to cancel or reschedule your appointment, please call the office within 24 hours of your appointment. . Patient verbalized understanding.Deborah Sheppard

## 2020-01-26 DIAGNOSIS — I10 Essential (primary) hypertension: Secondary | ICD-10-CM | POA: Diagnosis not present

## 2020-01-26 DIAGNOSIS — E119 Type 2 diabetes mellitus without complications: Secondary | ICD-10-CM | POA: Diagnosis not present

## 2020-01-26 DIAGNOSIS — N951 Menopausal and female climacteric states: Secondary | ICD-10-CM | POA: Diagnosis not present

## 2020-01-26 DIAGNOSIS — E1169 Type 2 diabetes mellitus with other specified complication: Secondary | ICD-10-CM | POA: Diagnosis not present

## 2020-01-28 ENCOUNTER — Ambulatory Visit: Payer: BC Managed Care – PPO

## 2020-01-28 DIAGNOSIS — R609 Edema, unspecified: Secondary | ICD-10-CM | POA: Insufficient documentation

## 2020-01-28 DIAGNOSIS — K760 Fatty (change of) liver, not elsewhere classified: Secondary | ICD-10-CM | POA: Insufficient documentation

## 2020-01-28 DIAGNOSIS — K219 Gastro-esophageal reflux disease without esophagitis: Secondary | ICD-10-CM | POA: Insufficient documentation

## 2020-02-01 NOTE — Progress Notes (Deleted)
Cardiology Office Note:    Date:  02/01/2020   ID:  [name], DOB 05/22/1976, MRN [id number]  PCP:  Earlyne Iba, NP  Cardiologist:  Shirlee More, MD    Referring MD: Earlyne Iba, NP    ASSESSMENT:    No diagnosis found. PLAN:    In order of problems listed above:  1. ***   Next appointment: ***   Medication Adjustments/Labs and Tests Ordered: Current medicines are reviewed at length with the patient today.  Concerns regarding medicines are outlined above.  No orders of the defined types were placed in this encounter.  No orders of the defined types were placed in this encounter.   No chief complaint on file.   History of Present Illness:    Deborah Sheppard is a 44 y.o. female with a hx of *** last seen 12/25/2019. Compliance with diet, lifestyle and medications: *** Past Medical History:  Diagnosis Date  . Essential hypertension 03/15/2016  . Fatigue 11/28/2017  . Fatty liver   . Gastroesophageal reflux disease without esophagitis 03/15/2016  . GERD (gastroesophageal reflux disease)   . 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 Obesity (BMI 30-39.9) 03/15/2016  . Peripheral edema   . Snoring 03/15/2016  . Type 2 diabetes mellitus without complication, without long-term current use of insulin ([place]) 03/15/2016  . Vitamin D deficiency 11/28/2017    Past Surgical History:  Procedure Laterality Date  . ABDOMINAL HYSTERECTOMY    . CERVICAL SPINE SURGERY    . CHOLECYSTECTOMY    . LIPOMA EXCISION    . MYRINGOTOMY WITH TUBE PLACEMENT    . TONSILLECTOMY AND ADENOIDECTOMY      Current Medications: No outpatient medications have been marked as taking for the 02/02/20 encounter (Appointment) with Richardo Priest, MD.     Allergies:   Sumatriptan, Clarithromycin, Latex, Ceftriaxone, Metformin and related, Sumatriptan succinate, Gabapentin, and Iodinated diagnostic agents   Social History   Socioeconomic History  . Marital status: Divorced    Spouse name: Not on file  .  Number of children: Not on file  . Years of education: Not on file  . Highest education level: Not on file  Occupational History  . Not on file  Tobacco Use  . Smoking status: Former Smoker    Packs/day: 1.00    Years: 8.00    Pack years: 8.00    Quit date: 01/2011    Years since quitting: 9.0  . Smokeless tobacco: Never Used  Vaping Use  . Vaping Use: Never used  Substance and Sexual Activity  . Alcohol use: Yes    Comment: rarely  . Drug use: Never  . Sexual activity: Not on file  Other Topics Concern  . Not on file  Social History Narrative  . Not on file   Social Determinants of Health   Financial Resource Strain:   . Difficulty of Paying Living Expenses: Not on file  Food Insecurity:   . Worried About Charity fundraiser in the Last Year: Not on file  . Ran Out of Food in the Last Year: Not on file  Transportation Needs:   . Lack of Transportation (Medical): Not on file  . Lack of Transportation (Non-Medical): Not on file  Physical Activity:   . Days of Exercise per Week: Not on file  . Minutes of Exercise per Session: Not on file  Stress:   . Feeling of Stress : Not on file  Social Connections:   . Frequency of  Communication with Friends and Family: Not on file  . Frequency of Social Gatherings with Friends and Family: Not on file  . Attends Religious Services: Not on file  . Active Member of Clubs or Organizations: Not on file  . Attends Archivist Meetings: Not on file  . Marital Status: Not on file     Family History: The patient's ***family history includes Diabetes in her mother; Heart attack in her paternal aunt; Hyperlipidemia in her mother; Hypertension in her mother; Migraines in her sister. ROS:   Please see the history of present illness.    All other systems reviewed and are negative.  EKGs/Labs/Other Studies Reviewed:    The following studies were reviewed today:  EKG:  EKG ordered today and personally reviewed.  The ekg ordered  today demonstrates ***  Recent Labs: No results found for requested labs within last 8760 hours.  Recent Lipid Panel No results found for: CHOL, TRIG, HDL, CHOLHDL, VLDL, LDLCALC, LDLDIRECT  Physical Exam:    VS:  There were no vitals taken for this visit.    Wt Readings from Last 3 Encounters:  01/07/20 (!) 234 lb (106.1 kg)  12/25/19 234 lb 3.2 oz (106.2 kg)  07/04/18 230 lb (104.3 kg)     GEN: *** Well nourished, well developed in no acute distress HEENT: Normal NECK: No JVD; No carotid bruits LYMPHATICS: No lymphadenopathy CARDIAC: ***RRR, no murmurs, rubs, gallops RESPIRATORY:  Clear to auscultation without rales, wheezing or rhonchi  ABDOMEN: Soft, non-tender, non-distended MUSCULOSKELETAL:  No edema; No deformity  SKIN: Warm and dry NEUROLOGIC:  Alert and oriented x 3 PSYCHIATRIC:  Normal affect    Signed, Shirlee More, MD  02/01/2020 10:22 AM    [facility]

## 2020-02-02 ENCOUNTER — Ambulatory Visit: Payer: BC Managed Care – PPO | Admitting: Cardiology

## 2020-02-09 ENCOUNTER — Telehealth: Payer: Self-pay

## 2020-02-09 NOTE — Telephone Encounter (Signed)
Spoke with patient regarding results and recommendation.  Patient verbalizes understanding and is agreeable to plan of care. Advised patient to call back with any issues or concerns.  

## 2020-02-09 NOTE — Telephone Encounter (Signed)
-----   Message from Richardo Priest, MD sent at 02/08/2020 11:50 AM EDT ----- No arrhythmia.  This is a good result

## 2020-02-11 ENCOUNTER — Ambulatory Visit: Payer: BC Managed Care – PPO | Admitting: Cardiology

## 2021-08-18 DIAGNOSIS — R079 Chest pain, unspecified: Secondary | ICD-10-CM | POA: Diagnosis not present
